# Patient Record
Sex: Female | Born: 1980
Health system: Southern US, Community
[De-identification: ages and names within clinical notes are randomized; demographics above are authoritative.]

## PROBLEM LIST (undated history)

## (undated) DIAGNOSIS — O24419 Gestational diabetes mellitus in pregnancy, unspecified control: Secondary | ICD-10-CM

## (undated) HISTORY — DX: Gestational diabetes mellitus in pregnancy, unspecified control: O24.419

---

## 2002-05-29 ENCOUNTER — Emergency Department (HOSPITAL_COMMUNITY): Admission: EM | Admit: 2002-05-29 | Discharge: 2002-05-29 | Payer: Self-pay | Admitting: Emergency Medicine

## 2002-05-29 ENCOUNTER — Encounter: Payer: Self-pay | Admitting: Emergency Medicine

## 2003-04-11 ENCOUNTER — Emergency Department (HOSPITAL_COMMUNITY): Admission: EM | Admit: 2003-04-11 | Discharge: 2003-04-12 | Payer: Self-pay | Admitting: Emergency Medicine

## 2003-09-16 ENCOUNTER — Inpatient Hospital Stay (HOSPITAL_COMMUNITY): Admission: AD | Admit: 2003-09-16 | Discharge: 2003-09-16 | Payer: Self-pay | Admitting: Family Medicine

## 2003-09-16 ENCOUNTER — Encounter (INDEPENDENT_AMBULATORY_CARE_PROVIDER_SITE_OTHER): Payer: Self-pay | Admitting: Specialist

## 2003-09-23 ENCOUNTER — Inpatient Hospital Stay (HOSPITAL_COMMUNITY): Admission: AD | Admit: 2003-09-23 | Discharge: 2003-09-23 | Payer: Self-pay | Admitting: *Deleted

## 2003-09-30 ENCOUNTER — Inpatient Hospital Stay (HOSPITAL_COMMUNITY): Admission: AD | Admit: 2003-09-30 | Discharge: 2003-09-30 | Payer: Self-pay | Admitting: Obstetrics & Gynecology

## 2004-10-29 ENCOUNTER — Inpatient Hospital Stay (HOSPITAL_COMMUNITY): Admission: AD | Admit: 2004-10-29 | Discharge: 2004-11-01 | Payer: Self-pay | Admitting: Obstetrics and Gynecology

## 2007-12-13 ENCOUNTER — Inpatient Hospital Stay (HOSPITAL_COMMUNITY): Admission: AD | Admit: 2007-12-13 | Discharge: 2007-12-15 | Payer: Self-pay | Admitting: Obstetrics & Gynecology

## 2008-11-06 ENCOUNTER — Emergency Department (HOSPITAL_COMMUNITY): Admission: EM | Admit: 2008-11-06 | Discharge: 2008-11-06 | Payer: Self-pay | Admitting: Emergency Medicine

## 2009-01-23 ENCOUNTER — Emergency Department (HOSPITAL_COMMUNITY): Admission: EM | Admit: 2009-01-23 | Discharge: 2009-01-23 | Payer: Self-pay | Admitting: Emergency Medicine

## 2009-09-25 ENCOUNTER — Emergency Department (HOSPITAL_COMMUNITY): Admission: EM | Admit: 2009-09-25 | Discharge: 2009-09-25 | Payer: Self-pay | Admitting: Family Medicine

## 2010-08-28 LAB — POCT URINALYSIS DIP (DEVICE)
Bilirubin Urine: NEGATIVE
Glucose, UA: NEGATIVE mg/dL
Hgb urine dipstick: NEGATIVE
Ketones, ur: NEGATIVE mg/dL
Nitrite: NEGATIVE
Protein, ur: NEGATIVE mg/dL
Specific Gravity, Urine: >=1.03 (ref 1.005–1.030)
Urobilinogen, UA: 0.2 mg/dL (ref 0.0–1.0)
pH: 5.5 (ref 5.0–8.0)

## 2010-08-28 LAB — POCT I-STAT, CHEM 8
Calcium, Ion: 1.16 mmol/L (ref 1.12–1.32)
Chloride: 103 mEq/L (ref 96–112)
Glucose, Bld: 85 mg/dL (ref 70–99)
HCT: 43 % (ref 36.0–46.0)
Hemoglobin: 14.6 g/dL (ref 12.0–15.0)
TCO2: 27 mmol/L (ref 0–100)

## 2010-08-28 LAB — GLUCOSE, CAPILLARY: Glucose-Capillary: 113 mg/dL — ABNORMAL HIGH (ref 70–99)

## 2010-10-26 NOTE — H&P (Signed)
Megan Barry, Megan Barry NO.:  [id number]   MEDICAL RECORD NO.:  [id number]           PATIENT TYPE:   LOCATION:                                 FACILITY:   PHYSICIAN:  Roseanna Rainbow, M.D. DATE OF BIRTH:   DATE OF ADMISSION:  10/29/2004  DATE OF DISCHARGE:                                HISTORY & PHYSICAL   CHIEF COMPLAINT:  The patient is a 30 year old, gravida 2, para 0, with an  estimated date of confinement of November 10, 2004, with an intrauterine  pregnancy at 38.2 weeks, with preeclampsia, who presents for induction of  labor.   HISTORY OF PRESENT ILLNESS:  Please see the above.  The patient has had  borderline blood pressures dating back to June 26, 2004, when, at 20  weeks, her blood pressure was 139/78, so there is a question of chronic  borderline blood pressures.  On Oct 24, 2004, her blood pressure was 151/96;  however, on repeat the blood pressure was 130/80.  At this point, she was  spilling 1+ protein; however, she had an asymptomatic bacteruria with GBS at  that time.  A PIH panel at that point was remarkable for hemoconcentration  with a hemoglobin of 13, and uric acid of 6.7.  Today, blood pressures were  159/91; on repeat, the blood pressure was 160/100.  There was 3+ proteinuria  with 2+ leukocyte esterase.  She denied any symptoms consistent with severe  preeclampsia.  Her CBGs were within normal limits per the patient's report.   ANTEPARTUM COURSE/PROBLEMS/RISKS:  1.  Please see the above.  2.  Gestational diabetes, on an oral agent.  3.  Sickle cell trait positive.   PRENATAL SCREENS:  Blood type O positive.  Antibody screen negative.  Chlamydia negative.  GBS positive.  GC negative.  Hepatitis B surface  antigen negative.  Pap smear within normal limits.  Hemoglobin 13.3,  platelets 254,000.  Sickle cell positive.  Urine culture and sensitivity -  please see the above.  RPR nonreactive.  Most recent ultrasound on Oct 26, 2004 at  37 weeks, 6 days revealed cephalic presentation.  Amniotic fluid  normal.  Placenta anterior.  Growth percentile 84th percentile.   ALLERGIES:  No known drug allergies.   MEDICATIONS:  1.  Prenatal vitamins.  2.  Glyburide.   PAST OBSTETRIC AND GYNECOLOGIC HISTORY:  Please see the above.  Remarkable  for a spontaneous abortion.   PAST MEDICAL HISTORY:  Please see the above.   FAMILY HISTORY:  Noncontributory.   SOCIAL HISTORY:  She denies any tobacco, ethanol, or drug use.   PHYSICAL EXAMINATION:  VITAL SIGNS:  Blood pressures 150s-160s/90s-100s.  GENERAL:  Well-developed, well-nourished, in no apparent distress.  ABDOMEN:  Gravid.  PELVIC:  Sterile vaginal exam - the cervix is 2-3 cm dilated, 80% effaced,  with the vertex at a -3 station.  EXTREMITIES:  Upper and lower extremities with 1 to 2+ edema.   ASSESSMENT:  Intrauterine pregnancy at 38 plus weeks with -  1.  Preeclampsia, likely mild.  2.  Gestational diabetes, on an  oral agent, euglycemic, and with an AGA      fetus.  3.  GBS positive.  4.  Favorable Bishop score.   PLAN:  Admission.  Likely magnesium sulfate seizure prophylaxis.  GBS  prophylaxis.  Induction of labor.  Likely Pitocin and artificial rupture of  membranes.      LAJ/MEDQ  D:  10/29/2004  T:  10/29/2004  Job:  [id number]

## 2011-03-07 LAB — CBC
HCT: 35.2 — ABNORMAL LOW
MCHC: 34.1
MCV: 91.7
MCV: 93
Platelets: 144 — ABNORMAL LOW
RBC: 4.22
RDW: 13.9
RDW: 14.2

## 2011-03-07 LAB — RPR: RPR Ser Ql: NONREACTIVE

## 2011-06-11 NOTE — L&D Delivery Note (Signed)
Delivery Note At 5:28 PM a viable female was delivered via Vaginal, Spontaneous Delivery (Presentation: LOA  ).  APGAR: 9, 10; weight 8 lb 3.6 oz (3731 g).   Placenta status: Intact, Spontaneous.  Cord: 3 vessels with the following complications: None.    Anesthesia: Epidural  Episiotomy: None Lacerations: 2nd degree Suture Repair: 2.0 vicryl rapide Est. Blood Loss (mL): 300 ml  Mom to AICU.  Baby to nursery-stable.  Barry,Megan Shrader A 05/20/2012, 6:17 PM

## 2011-11-06 LAB — OB RESULTS CONSOLE RPR: RPR: NONREACTIVE

## 2011-11-06 LAB — OB RESULTS CONSOLE HIV ANTIBODY (ROUTINE TESTING): HIV: NONREACTIVE

## 2011-11-06 LAB — OB RESULTS CONSOLE GC/CHLAMYDIA: Gonorrhea: NEGATIVE

## 2011-11-07 ENCOUNTER — Other Ambulatory Visit: Payer: Self-pay | Admitting: Obstetrics

## 2011-11-07 DIAGNOSIS — Z369 Encounter for antenatal screening, unspecified: Secondary | ICD-10-CM

## 2011-11-28 ENCOUNTER — Other Ambulatory Visit: Payer: Self-pay

## 2011-11-28 ENCOUNTER — Ambulatory Visit (HOSPITAL_COMMUNITY)
Admission: RE | Admit: 2011-11-28 | Discharge: 2011-11-28 | Disposition: A | Discharge status: Home / Self Care | Payer: 59 | Source: Ambulatory Visit | Attending: Obstetrics | Admitting: Obstetrics

## 2011-11-28 ENCOUNTER — Encounter (HOSPITAL_COMMUNITY): Payer: Self-pay

## 2011-11-28 ENCOUNTER — Ambulatory Visit (HOSPITAL_COMMUNITY)
Admission: RE | Admit: 2011-11-28 | Discharge: 2011-11-28 | Disposition: A | Discharge status: Home / Self Care | Payer: 59 | Source: Ambulatory Visit | Attending: Obstetrics & Gynecology | Admitting: Obstetrics & Gynecology

## 2011-11-28 DIAGNOSIS — Z3689 Encounter for other specified antenatal screening: Secondary | ICD-10-CM | POA: Insufficient documentation

## 2011-11-28 DIAGNOSIS — O351XX0 Maternal care for (suspected) chromosomal abnormality in fetus, not applicable or unspecified: Secondary | ICD-10-CM | POA: Insufficient documentation

## 2011-11-28 DIAGNOSIS — O3510X0 Maternal care for (suspected) chromosomal abnormality in fetus, unspecified, not applicable or unspecified: Secondary | ICD-10-CM | POA: Insufficient documentation

## 2011-11-28 DIAGNOSIS — Z369 Encounter for antenatal screening, unspecified: Secondary | ICD-10-CM

## 2012-01-20 ENCOUNTER — Other Ambulatory Visit: Payer: Self-pay | Admitting: Obstetrics

## 2012-01-20 DIAGNOSIS — Z3689 Encounter for other specified antenatal screening: Secondary | ICD-10-CM

## 2012-01-28 ENCOUNTER — Ambulatory Visit (HOSPITAL_COMMUNITY): Payer: 59

## 2012-02-04 ENCOUNTER — Ambulatory Visit (HOSPITAL_COMMUNITY): Payer: 59

## 2012-02-06 ENCOUNTER — Ambulatory Visit (HOSPITAL_COMMUNITY)
Admission: RE | Admit: 2012-02-06 | Discharge: 2012-02-06 | Disposition: A | Discharge status: Home / Self Care | Payer: 59 | Source: Ambulatory Visit | Attending: Obstetrics | Admitting: Obstetrics

## 2012-02-06 DIAGNOSIS — Z363 Encounter for antenatal screening for malformations: Secondary | ICD-10-CM | POA: Insufficient documentation

## 2012-02-06 DIAGNOSIS — Z1389 Encounter for screening for other disorder: Secondary | ICD-10-CM | POA: Insufficient documentation

## 2012-02-06 DIAGNOSIS — Z3689 Encounter for other specified antenatal screening: Secondary | ICD-10-CM

## 2012-02-06 DIAGNOSIS — O09299 Supervision of pregnancy with other poor reproductive or obstetric history, unspecified trimester: Secondary | ICD-10-CM | POA: Insufficient documentation

## 2012-02-06 DIAGNOSIS — O358XX0 Maternal care for other (suspected) fetal abnormality and damage, not applicable or unspecified: Secondary | ICD-10-CM | POA: Insufficient documentation

## 2012-02-21 ENCOUNTER — Inpatient Hospital Stay (HOSPITAL_COMMUNITY)
Admission: AD | Admit: 2012-02-21 | Discharge: 2012-02-21 | Disposition: A | Discharge status: Home / Self Care | Payer: 59 | Source: Ambulatory Visit | Attending: Obstetrics | Admitting: Obstetrics

## 2012-02-21 ENCOUNTER — Inpatient Hospital Stay (HOSPITAL_COMMUNITY): Payer: 59

## 2012-02-21 ENCOUNTER — Encounter (HOSPITAL_COMMUNITY): Payer: Self-pay | Admitting: *Deleted

## 2012-02-21 DIAGNOSIS — N289 Disorder of kidney and ureter, unspecified: Secondary | ICD-10-CM

## 2012-02-21 DIAGNOSIS — Z331 Pregnant state, incidental: Secondary | ICD-10-CM

## 2012-02-21 DIAGNOSIS — O99891 Other specified diseases and conditions complicating pregnancy: Secondary | ICD-10-CM | POA: Insufficient documentation

## 2012-02-21 DIAGNOSIS — R109 Unspecified abdominal pain: Secondary | ICD-10-CM | POA: Insufficient documentation

## 2012-02-21 DIAGNOSIS — N200 Calculus of kidney: Secondary | ICD-10-CM | POA: Insufficient documentation

## 2012-02-21 DIAGNOSIS — O26839 Pregnancy related renal disease, unspecified trimester: Secondary | ICD-10-CM

## 2012-02-21 LAB — URINALYSIS, ROUTINE W REFLEX MICROSCOPIC
Bilirubin Urine: NEGATIVE
Ketones, ur: NEGATIVE mg/dL
Nitrite: NEGATIVE
Specific Gravity, Urine: >1.03 — ABNORMAL HIGH (ref 1.005–1.030)
Urobilinogen, UA: 0.2 mg/dL (ref 0.0–1.0)

## 2012-02-21 MED ORDER — TAMSULOSIN HCL 0.4 MG PO CAPS: 0.4000 mg | ORAL_CAPSULE | Freq: Once | ORAL | Status: DC

## 2012-02-21 MED ORDER — MORPHINE SULFATE 10 MG/ML IJ SOLN
10.0000 mg | Freq: Once | INTRAMUSCULAR | Status: AC
  Administered 2012-02-21: 10 mg via INTRAVENOUS
  Filled 2012-02-21: qty 1

## 2012-02-21 MED ORDER — SODIUM CHLORIDE 0.9 % IJ SOLN
INTRAMUSCULAR | Status: AC
  Administered 2012-02-21: 3 mL
  Filled 2012-02-21: qty 3

## 2012-02-21 MED ORDER — OXYCODONE-ACETAMINOPHEN 5-325 MG PO TABS: 1.0000 | ORAL_TABLET | Freq: Once | ORAL | Status: DC

## 2012-02-21 MED ORDER — TAMSULOSIN HCL 0.4 MG PO CAPS
0.4000 mg | ORAL_CAPSULE | Freq: Once | ORAL | Status: AC
  Administered 2012-02-21: 0.4 mg via ORAL
  Filled 2012-02-21: qty 1

## 2012-02-21 MED ORDER — MORPHINE SULFATE 10 MG/5ML PO SOLN: 10.0000 mg | Freq: Once | ORAL | Status: DC

## 2012-02-21 MED ORDER — OXYCODONE-ACETAMINOPHEN 5-325 MG PO TABS: 1.0000 | ORAL_TABLET | ORAL | Status: AC | PRN

## 2012-02-21 MED ORDER — PROMETHAZINE HCL 25 MG/ML IJ SOLN
25.0000 mg | Freq: Once | INTRAMUSCULAR | Status: AC
  Administered 2012-02-21: 25 mg via INTRAVENOUS
  Filled 2012-02-21: qty 1

## 2012-02-21 NOTE — MAU Provider Note (Signed)
Chief Complaint:  Abdominal Pain   First Provider Initiated Contact with Patient 02/21/12 1510     HPI: Megan Barry is a 31 y.o. [id number] at 9w2dwho presents to maternity admissions reporting flank pain since last night, much more severe today, rates 10/10 on pain scale. Describes pain as sharp, intermittent, but more frequent this afternoon. Worse lying down. Has not tried any thing for pain relief. Describes it urine is dark reddish-brown. Denies fever, chills, dysuria, frequency, nausea, vomiting, diarrhea, constipation, contractions, leakage of fluid or vaginal bleeding. Good fetal movement.   Past Medical History: History reviewed. No pertinent past medical history.  Past obstetric history: OB History    Grav Para Term Preterm Abortions TAB SAB Ect Mult Living   4 2 2  1  1   2      # Outc Date GA Lbr Len/2nd Wgt Sex Del Anes PTL Lv   1 SAB 2005           2 TRM 2006 [redacted]w[redacted]d  6lb(2.722kg) F SVD      3 TRM 2009 [redacted]w[redacted]d  7lb(3.175kg) F SVD      4 CUR               Past Surgical History: Past Surgical History  Procedure Date  . Vaginal delivery     X 2    Family History: History reviewed. No pertinent family history.  Social History: History  Substance Use Topics  . Smoking status: Never Smoker   . Smokeless tobacco: Never Used  . Alcohol Use: No    Allergies: No Known Allergies  Meds:  Prescriptions prior to admission  Medication Sig Dispense Refill  . Prenatal Vit-Fe Fumarate-FA (PRENATAL MULTIVITAMIN) TABS Take 1 tablet by mouth daily.        ROS: Pertinent findings in history of present illness.  Physical Exam  Blood pressure 123/79, pulse 97, temperature 98.5 F (36.9 C), temperature source Oral, resp. rate 18, height 5\' 6"  (1.676 m), weight 104.509 kg (230 lb 6.4 oz), last menstrual period 09/04/2011, SpO2 99.00%. GENERAL: Well-developed, well-nourished female in moderate distress.  HEENT: normocephalic HEART: normal rate RESP: normal effort ABDOMEN: Soft,  non-tender, gravid appropriate for gestational age. No CVA tenderness. No uterine tenderness. EXTREMITIES: Nontender, no edema NEURO: alert and oriented SPECULUM EXAM: Deferred. No blood on bed pad.   FHT:  Baseline 150 , moderate variability, no accelerations present, no decelerations Contractions: none   Labs: Results for orders placed during the hospital encounter of 02/21/12 (from the past 24 hour(s))  URINALYSIS, ROUTINE W REFLEX MICROSCOPIC     Status: Abnormal   Collection Time   02/21/12  2:30 PM      Component Value Range   Color, Urine YELLOW  YELLOW   APPearance HAZY (*) CLEAR   Specific Gravity, Urine >1.030 (*) 1.005 - 1.030   pH 5.5  5.0 - 8.0   Glucose, UA NEGATIVE  NEGATIVE mg/dL   Hgb urine dipstick LARGE (*) NEGATIVE   Bilirubin Urine NEGATIVE  NEGATIVE   Ketones, ur NEGATIVE  NEGATIVE mg/dL   Protein, ur NEGATIVE  NEGATIVE mg/dL   Urobilinogen, UA 0.2  0.0 - 1.0 mg/dL   Nitrite NEGATIVE  NEGATIVE   Leukocytes, UA SMALL (*) NEGATIVE  URINE MICROSCOPIC-ADD ON     Status: Abnormal   Collection Time   02/21/12  2:30 PM      Component Value Range   Squamous Epithelial / LPF MANY (*) RARE   WBC, UA 21-50  <3  WBC/hpf   RBC / HPF TOO NUMEROUS TO COUNT  <3 RBC/hpf   Bacteria, UA MANY (*) RARE   Urine-Other MANY YEAST      Imaging:  US Ob Detail + 14 Wk  02/06/2012  OBSTETRICAL ULTRASOUND: This exam was performed within a [facility]. The OB US report was generated in the [facility] system, and faxed to the ordering physician.   This report is also available in [employer] and in the [employer]. See [facility] Obstetric US report.   US Renal  02/21/2012  *RADIOLOGY REPORT*  Clinical Data: Left flank pain with hematuria.  24 weeks estimated gestational age  RENAL/URINARY TRACT ULTRASOUND COMPLETE  Comparison:  None.  Findings:  Right Kidney:  Demonstrates a sagittal length of 12.1 cm.  No focal parenchymal abnormalities or signs of  hydronephrosis are evident  Left Kidney:  Demonstrates a sagittal length of 14.6 cm. Mild hydronephrosis is identified with dilatation of the extrarenal and intra renal pelves and mild distention of the calyceal system. This is more prominent than typically anticipated for a 24-week gestation.  The proximal ureter appears mildly dilated as well. No focal parenchymal abnormalities are identified.  Bladder:  Evaluation of the bladder reveals bilateral ureteral jets  IMPRESSION: Normal right kidney.  Assymetric mild hydronephrosis associated with the left kidney with no definite focal parenchymal abnormalities seen. Given that this finding affects the left side only with a history of left flank pain and hematuria and is slightly more pronounced than expected at current gestational age, a nonvisualized ureteral calculus cannot be excluded. The presence of bilateral ureteral jets would mitigate against a complete obstructive process.   Original Report Authenticated By: Bertha Stakes, M.D.    ED Course Pain improved to 2/10 on pain scale after morphine 10 mg and Phenergan 25 mg IV. Fluid bolus given. Patient resting, NAD. Dr. Clearance Coots notified of physical exam findings suggestive of kidney stone, left hydronephrosis without visualization of stone. Will arrange referral to [facility]. At time of discharge patient stated pain was returning in requested more pain medication before discharge. 1 Percocet given. Flomax given.  Assessment: 1. Kidney stone complicating pregnancy   2. Pregnancy, incidental    Plan: Discharge home Push fluids. Preterm labor precautions. Urine culture pending Follow-up Information    Call HARPER,CHARLES A, MD. (to arrange referral to urologist. )    Contact information:   [address] [place] [address] [phone]       Follow up with [facility] [place] MATERNITY ADMISSIONS. (As needed if symptoms worsen)    Contact information:    [address] [place] [address] [phone]          Medication List     As of 02/21/2012  7:17 PM    TAKE these medications         oxyCODONE-acetaminophen 5-325 MG per tablet   Commonly known as: PERCOCET/ROXICET   Take 1-2 tablets by mouth every 4 (four) hours as needed for pain.      prenatal multivitamin Tabs   Take 1 tablet by mouth daily.      Tamsulosin HCl 0.4 MG Caps   Commonly known as: FLOMAX   Take 1 capsule (0.4 mg total) by mouth once.        [place], CNM 02/21/2012 3:08 PM

## 2012-02-21 NOTE — MAU Note (Signed)
Patient states she started having constant left side pain last night. Denies bleeding or leaking and reports feeling fetal movement. States her urine is brown but has no pain with urination.

## 2012-02-23 LAB — URINE CULTURE: Colony Count: 5000

## 2012-03-30 ENCOUNTER — Ambulatory Visit: Payer: 59 | Admitting: Physical Therapy

## 2012-04-14 LAB — OB RESULTS CONSOLE GBS: GBS: NEGATIVE

## 2012-05-11 ENCOUNTER — Other Ambulatory Visit: Payer: Self-pay | Admitting: Obstetrics & Gynecology

## 2012-05-11 DIAGNOSIS — IMO0002 Reserved for concepts with insufficient information to code with codable children: Secondary | ICD-10-CM

## 2012-05-14 ENCOUNTER — Ambulatory Visit (HOSPITAL_COMMUNITY)
Admission: RE | Admit: 2012-05-14 | Discharge: 2012-05-14 | Disposition: A | Discharge status: Home / Self Care | Payer: 59 | Source: Ambulatory Visit | Attending: Obstetrics & Gynecology | Admitting: Obstetrics & Gynecology

## 2012-05-14 ENCOUNTER — Other Ambulatory Visit: Payer: Self-pay | Admitting: Obstetrics & Gynecology

## 2012-05-14 DIAGNOSIS — O24419 Gestational diabetes mellitus in pregnancy, unspecified control: Secondary | ICD-10-CM

## 2012-05-14 DIAGNOSIS — O3660X0 Maternal care for excessive fetal growth, unspecified trimester, not applicable or unspecified: Secondary | ICD-10-CM | POA: Insufficient documentation

## 2012-05-14 DIAGNOSIS — O99810 Abnormal glucose complicating pregnancy: Secondary | ICD-10-CM | POA: Insufficient documentation

## 2012-05-14 DIAGNOSIS — O09299 Supervision of pregnancy with other poor reproductive or obstetric history, unspecified trimester: Secondary | ICD-10-CM | POA: Insufficient documentation

## 2012-05-14 DIAGNOSIS — IMO0002 Reserved for concepts with insufficient information to code with codable children: Secondary | ICD-10-CM

## 2012-05-15 ENCOUNTER — Telehealth (HOSPITAL_COMMUNITY): Payer: Self-pay | Admitting: *Deleted

## 2012-05-15 ENCOUNTER — Encounter (HOSPITAL_COMMUNITY): Payer: Self-pay | Admitting: *Deleted

## 2012-05-15 NOTE — Telephone Encounter (Signed)
Preadmission screen  

## 2012-05-18 ENCOUNTER — Encounter (HOSPITAL_COMMUNITY): Payer: Self-pay | Admitting: *Deleted

## 2012-05-18 ENCOUNTER — Ambulatory Visit (HOSPITAL_COMMUNITY)
Admission: RE | Admit: 2012-05-18 | Discharge: 2012-05-18 | Disposition: A | Discharge status: Home / Self Care | Payer: 59 | Source: Ambulatory Visit | Attending: Obstetrics & Gynecology | Admitting: Obstetrics & Gynecology

## 2012-05-18 ENCOUNTER — Inpatient Hospital Stay (HOSPITAL_COMMUNITY)
Admission: AD | Admit: 2012-05-18 | Discharge: 2012-05-18 | Disposition: A | Discharge status: Home / Self Care | Payer: 59 | Source: Ambulatory Visit | Attending: Obstetrics | Admitting: Obstetrics

## 2012-05-18 DIAGNOSIS — O24419 Gestational diabetes mellitus in pregnancy, unspecified control: Secondary | ICD-10-CM

## 2012-05-18 DIAGNOSIS — O99810 Abnormal glucose complicating pregnancy: Secondary | ICD-10-CM | POA: Insufficient documentation

## 2012-05-18 DIAGNOSIS — Z362 Encounter for other antenatal screening follow-up: Secondary | ICD-10-CM

## 2012-05-18 DIAGNOSIS — O3660X0 Maternal care for excessive fetal growth, unspecified trimester, not applicable or unspecified: Secondary | ICD-10-CM | POA: Insufficient documentation

## 2012-05-18 LAB — LSPG (L/S RATIO WITH PG)-AMNIO FLUID

## 2012-05-18 NOTE — Procedures (Signed)
Informed consent had been obtained.  The abdomen was prepped/draped.  An attempt was made to introduce a spinal needle into the amniotic cavity.  A fetal limb moved into pocket and the needle could not be advanced.  The needle was removed.  A second attempt was made and 10 cc of slightly blood-tinged fluid was aspirated.  A normal heart rate was observed after the procedure.

## 2012-05-18 NOTE — MAU Provider Note (Signed)
HPI: Dezyre Hoefer is a 31 y.o. year old [id number] female at [redacted]w[redacted]d weeks gestation who was sent to MAU for NSt after Amnio. Denies pain, VB, LOF. NST category I.  Discharge home. Return to MAUfor VB LOF, labor or decreased FM.  Follow-up Information    Follow up with [facility] BIRTHING SUITES. On 05/20/2012.      Follow up with [facility] [place] MATERNITY ADMISSIONS. (As needed)    Contact information:   [address] [id number] [place] [address] [phone]           Medication List     As of 05/18/2012  3:21 PM    CONTINUE taking these medications         insulin glargine 100 UNIT/ML injection   Commonly known as: LANTUS      insulin lispro 100 UNIT/ML injection   Commonly known as: HUMALOG      prenatal multivitamin Tabs          Dorathy Kinsman, CNM 05/18/2012 3:21 PM

## 2012-05-18 NOTE — MAU Note (Signed)
Pt seen @ MFM for amniocentesis today, needs NST.

## 2012-05-18 NOTE — MAU Note (Signed)
Scheduled for induction this Wed. (05/20/12);

## 2012-05-20 ENCOUNTER — Inpatient Hospital Stay (HOSPITAL_COMMUNITY): Payer: 59 | Admitting: Anesthesiology

## 2012-05-20 ENCOUNTER — Encounter (HOSPITAL_COMMUNITY): Payer: Self-pay

## 2012-05-20 ENCOUNTER — Inpatient Hospital Stay (HOSPITAL_COMMUNITY)
Admission: RE | Admit: 2012-05-20 | Discharge: 2012-05-23 | DRG: 775 | Disposition: A | Discharge status: Home / Self Care | Payer: 59 | Source: Ambulatory Visit | Attending: Obstetrics & Gynecology | Admitting: Obstetrics & Gynecology

## 2012-05-20 ENCOUNTER — Encounter (HOSPITAL_COMMUNITY): Payer: Self-pay | Admitting: Anesthesiology

## 2012-05-20 DIAGNOSIS — O99814 Abnormal glucose complicating childbirth: Principal | ICD-10-CM | POA: Diagnosis present

## 2012-05-20 DIAGNOSIS — O1490 Unspecified pre-eclampsia, unspecified trimester: Secondary | ICD-10-CM | POA: Clinically undetermined

## 2012-05-20 DIAGNOSIS — D689 Coagulation defect, unspecified: Secondary | ICD-10-CM | POA: Diagnosis present

## 2012-05-20 DIAGNOSIS — D696 Thrombocytopenia, unspecified: Secondary | ICD-10-CM | POA: Diagnosis present

## 2012-05-20 DIAGNOSIS — O24919 Unspecified diabetes mellitus in pregnancy, unspecified trimester: Secondary | ICD-10-CM | POA: Diagnosis present

## 2012-05-20 DIAGNOSIS — O3660X0 Maternal care for excessive fetal growth, unspecified trimester, not applicable or unspecified: Secondary | ICD-10-CM | POA: Diagnosis present

## 2012-05-20 LAB — COMPREHENSIVE METABOLIC PANEL
Alkaline Phosphatase: 108 U/L (ref 39–117)
BUN: 7 mg/dL (ref 6–23)
CO2: 21 mEq/L (ref 19–32)
GFR calc Af Amer: >90 mL/min (ref >90)
GFR calc non Af Amer: >90 mL/min (ref >90)
Glucose, Bld: 94 mg/dL (ref 70–99)
Potassium: 3.6 mEq/L (ref 3.5–5.1)
Total Protein: 5.8 g/dL — ABNORMAL LOW (ref 6.0–8.3)

## 2012-05-20 LAB — CBC
HCT: 36.4 % (ref 36.0–46.0)
MCH: 29.9 pg (ref 26.0–34.0)
MCHC: 33.5 g/dL (ref 30.0–36.0)
MCV: 89.2 fL (ref 78.0–100.0)
Platelets: 128 10*3/uL — ABNORMAL LOW (ref 150–400)
RDW: 13.8 % (ref 11.5–15.5)

## 2012-05-20 LAB — LACTATE DEHYDROGENASE: LDH: 138 U/L (ref 94–250)

## 2012-05-20 LAB — ABO/RH: ABO/RH(D): O POS

## 2012-05-20 LAB — GLUCOSE, CAPILLARY: Glucose-Capillary: 99 mg/dL (ref 70–99)

## 2012-05-20 MED ORDER — TETANUS-DIPHTH-ACELL PERTUSSIS 5-2.5-18.5 LF-MCG/0.5 IM SUSP
0.5000 mL | Freq: Once | INTRAMUSCULAR | Status: AC
  Administered 2012-05-21: 0.5 mL via INTRAMUSCULAR
  Filled 2012-05-20: qty 0.5

## 2012-05-20 MED ORDER — PHENYLEPHRINE 40 MCG/ML (10ML) SYRINGE FOR IV PUSH (FOR BLOOD PRESSURE SUPPORT): 80.0000 ug | PREFILLED_SYRINGE | INTRAVENOUS | Status: DC | PRN

## 2012-05-20 MED ORDER — MAGNESIUM SULFATE BOLUS VIA INFUSION
4.0000 g | Freq: Once | INTRAVENOUS | Status: AC
  Administered 2012-05-20: 4 g via INTRAVENOUS
  Filled 2012-05-20: qty 500

## 2012-05-20 MED ORDER — PHENYLEPHRINE 40 MCG/ML (10ML) SYRINGE FOR IV PUSH (FOR BLOOD PRESSURE SUPPORT)
80.0000 ug | PREFILLED_SYRINGE | INTRAVENOUS | Status: DC | PRN
  Filled 2012-05-20: qty 5

## 2012-05-20 MED ORDER — INSULIN ASPART 100 UNIT/ML ~~LOC~~ SOLN: 2.0000 [IU] | SUBCUTANEOUS | Status: DC

## 2012-05-20 MED ORDER — OXYTOCIN 40 UNITS IN LACTATED RINGERS INFUSION - SIMPLE MED
62.5000 mL/h | INTRAVENOUS | Status: DC
  Administered 2012-05-20: 999 mL/h via INTRAVENOUS

## 2012-05-20 MED ORDER — LACTATED RINGERS IV SOLN
500.0000 mL | INTRAVENOUS | Status: DC | PRN
Start: 2012-05-20 — End: 2012-05-20

## 2012-05-20 MED ORDER — LACTATED RINGERS IV SOLN
INTRAVENOUS | Status: DC
  Administered 2012-05-20 – 2012-05-21 (×2): via INTRAVENOUS

## 2012-05-20 MED ORDER — EPHEDRINE 5 MG/ML INJ
10.0000 mg | INTRAVENOUS | Status: DC | PRN
  Filled 2012-05-20: qty 4

## 2012-05-20 MED ORDER — OXYCODONE-ACETAMINOPHEN 5-325 MG PO TABS: 1.0000 | ORAL_TABLET | ORAL | Status: DC | PRN

## 2012-05-20 MED ORDER — ZOLPIDEM TARTRATE 5 MG PO TABS: 5.0000 mg | ORAL_TABLET | Freq: Every evening | ORAL | Status: DC | PRN

## 2012-05-20 MED ORDER — DIPHENHYDRAMINE HCL 50 MG/ML IJ SOLN: 12.5000 mg | INTRAMUSCULAR | Status: DC | PRN

## 2012-05-20 MED ORDER — SODIUM BICARBONATE 8.4 % IV SOLN
INTRAVENOUS | Status: DC | PRN
  Administered 2012-05-20: 5 mL via EPIDURAL

## 2012-05-20 MED ORDER — ONDANSETRON HCL 4 MG/2ML IJ SOLN: 4.0000 mg | Freq: Four times a day (QID) | INTRAMUSCULAR | Status: DC | PRN

## 2012-05-20 MED ORDER — IBUPROFEN 600 MG PO TABS: 600.0000 mg | ORAL_TABLET | Freq: Four times a day (QID) | ORAL | Status: DC | PRN

## 2012-05-20 MED ORDER — DIBUCAINE 1 % RE OINT: 1.0000 "application " | TOPICAL_OINTMENT | RECTAL | Status: DC | PRN

## 2012-05-20 MED ORDER — OXYTOCIN BOLUS FROM INFUSION: 500.0000 mL | INTRAVENOUS | Status: DC

## 2012-05-20 MED ORDER — PRENATAL MULTIVITAMIN CH
1.0000 | ORAL_TABLET | Freq: Every day | ORAL | Status: DC
  Administered 2012-05-21 – 2012-05-23 (×3): 1 via ORAL
  Filled 2012-05-20 (×3): qty 1

## 2012-05-20 MED ORDER — FERROUS SULFATE 325 (65 FE) MG PO TABS
325.0000 mg | ORAL_TABLET | Freq: Two times a day (BID) | ORAL | Status: DC
  Administered 2012-05-21 – 2012-05-23 (×5): 325 mg via ORAL
  Filled 2012-05-20 (×5): qty 1

## 2012-05-20 MED ORDER — TERBUTALINE SULFATE 1 MG/ML IJ SOLN: 0.2500 mg | Freq: Once | INTRAMUSCULAR | Status: DC | PRN

## 2012-05-20 MED ORDER — DIPHENHYDRAMINE HCL 25 MG PO CAPS: 25.0000 mg | ORAL_CAPSULE | Freq: Four times a day (QID) | ORAL | Status: DC | PRN

## 2012-05-20 MED ORDER — LIDOCAINE HCL (PF) 1 % IJ SOLN
30.0000 mL | INTRAMUSCULAR | Status: AC | PRN
  Administered 2012-05-20: 30 mL via SUBCUTANEOUS
  Filled 2012-05-20: qty 30

## 2012-05-20 MED ORDER — MAGNESIUM SULFATE 40 G IN LACTATED RINGERS - SIMPLE
2.0000 g/h | INTRAVENOUS | Status: AC
  Administered 2012-05-21: 2 g/h via INTRAVENOUS
  Filled 2012-05-20 (×2): qty 500

## 2012-05-20 MED ORDER — BUTORPHANOL TARTRATE 1 MG/ML IJ SOLN
1.0000 mg | INTRAMUSCULAR | Status: DC | PRN
  Filled 2012-05-20: qty 2

## 2012-05-20 MED ORDER — HYDROXYZINE HCL 50 MG PO TABS: 50.0000 mg | ORAL_TABLET | Freq: Four times a day (QID) | ORAL | Status: DC | PRN

## 2012-05-20 MED ORDER — FENTANYL 2.5 MCG/ML BUPIVACAINE 1/10 % EPIDURAL INFUSION (WH - ANES)
14.0000 mL/h | INTRAMUSCULAR | Status: DC
  Administered 2012-05-20: 14 mL/h via EPIDURAL
  Filled 2012-05-20: qty 125

## 2012-05-20 MED ORDER — LACTATED RINGERS IV SOLN
500.0000 mL | Freq: Once | INTRAVENOUS | Status: AC
  Administered 2012-05-20: 500 mL via INTRAVENOUS

## 2012-05-20 MED ORDER — EPHEDRINE 5 MG/ML INJ: 10.0000 mg | INTRAVENOUS | Status: DC | PRN

## 2012-05-20 MED ORDER — BENZOCAINE-MENTHOL 20-0.5 % EX AERO
1.0000 "application " | INHALATION_SPRAY | CUTANEOUS | Status: DC | PRN
  Filled 2012-05-20: qty 56

## 2012-05-20 MED ORDER — SENNOSIDES-DOCUSATE SODIUM 8.6-50 MG PO TABS
2.0000 | ORAL_TABLET | Freq: Every day | ORAL | Status: DC
  Administered 2012-05-20 – 2012-05-22 (×3): 2 via ORAL

## 2012-05-20 MED ORDER — CITRIC ACID-SODIUM CITRATE 334-500 MG/5ML PO SOLN: 30.0000 mL | ORAL | Status: DC | PRN

## 2012-05-20 MED ORDER — MAGNESIUM HYDROXIDE 400 MG/5ML PO SUSP
30.0000 mL | ORAL | Status: DC | PRN
  Filled 2012-05-20: qty 30

## 2012-05-20 MED ORDER — ACETAMINOPHEN 325 MG PO TABS: 650.0000 mg | ORAL_TABLET | ORAL | Status: DC | PRN

## 2012-05-20 MED ORDER — MEASLES, MUMPS & RUBELLA VAC ~~LOC~~ INJ: 0.5000 mL | INJECTION | Freq: Once | SUBCUTANEOUS | Status: DC

## 2012-05-20 MED ORDER — ONDANSETRON HCL 4 MG/2ML IJ SOLN: 4.0000 mg | INTRAMUSCULAR | Status: DC | PRN

## 2012-05-20 MED ORDER — WITCH HAZEL-GLYCERIN EX PADS: 1.0000 "application " | MEDICATED_PAD | CUTANEOUS | Status: DC | PRN

## 2012-05-20 MED ORDER — LACTATED RINGERS IV SOLN
INTRAVENOUS | Status: DC
  Administered 2012-05-20: 125 mL/h via INTRAVENOUS
  Administered 2012-05-20: 15:00:00 via INTRAVENOUS

## 2012-05-20 MED ORDER — OXYCODONE-ACETAMINOPHEN 5-325 MG PO TABS
1.0000 | ORAL_TABLET | ORAL | Status: DC | PRN
  Administered 2012-05-21 – 2012-05-23 (×10): 2 via ORAL
  Filled 2012-05-20 (×10): qty 2

## 2012-05-20 MED ORDER — OXYTOCIN 40 UNITS IN LACTATED RINGERS INFUSION - SIMPLE MED
1.0000 m[IU]/min | INTRAVENOUS | Status: DC
  Administered 2012-05-20: 2 m[IU]/min via INTRAVENOUS
  Filled 2012-05-20: qty 1000

## 2012-05-20 MED ORDER — ONDANSETRON HCL 4 MG PO TABS: 4.0000 mg | ORAL_TABLET | ORAL | Status: DC | PRN

## 2012-05-20 MED ORDER — BUTORPHANOL TARTRATE 1 MG/ML IJ SOLN
2.0000 mg | INTRAMUSCULAR | Status: DC | PRN
  Administered 2012-05-20: 2 mg via INTRAVENOUS

## 2012-05-20 MED ORDER — HYDROXYZINE HCL 50 MG/ML IM SOLN: 50.0000 mg | Freq: Four times a day (QID) | INTRAMUSCULAR | Status: DC | PRN

## 2012-05-20 MED ORDER — LANOLIN HYDROUS EX OINT: TOPICAL_OINTMENT | CUTANEOUS | Status: DC | PRN

## 2012-05-20 NOTE — H&P (Signed)
Megan Barry is Barry 31 y.o. female presenting for IOL. Maternal Medical History:  Reason for admission: Barry recent U/S showed biometry consistent with an LGA fetus.  Her glycemic control has been borderline on insulin.  Fetal activity: Perceived fetal activity is normal.    Prenatal Complications - Diabetes: Diabetes is managed by insulin injections.      OB History    Grav Para Term Preterm Abortions TAB SAB Ect Mult Living   4 2 2  1  1   2      Past Medical History  Diagnosis Date  . Gestational diabetes     insulin   Past Surgical History  Procedure Date  . Vaginal delivery     X 2   Family History: family history includes Diabetes in her mother and Hypertension in her mother. Social History:  reports that she has never smoked. She has never used smokeless tobacco. She reports that she does not drink alcohol or use illicit drugs.     Review of Systems  Constitutional: Negative for fever.  Eyes: Negative for blurred vision.  Respiratory: Negative for shortness of breath.   Gastrointestinal: Negative for vomiting.  Skin: Negative for rash.  Neurological: Negative for headaches.    Dilation: 2 Effacement (%): 50 Station: -3 Exam by:: lee Blood pressure 146/72, pulse 86, temperature 98.5 F (36.9 C), temperature source Oral, resp. rate 16, height 5\' 8"  (1.727 m), weight 110.224 kg (243 lb), last menstrual period 09/04/2011. Maternal Exam:  Introitus: not evaluated.   Cervix: Cervix evaluated by digital exam.     Fetal Exam Fetal Monitor Review: Variability: moderate (6-25 bpm).   Pattern: accelerations present and no decelerations.    Fetal State Assessment: Category I - tracings are normal.     Physical Exam  Constitutional: She appears well-developed.  HENT:  Head: Normocephalic.  Neck: Neck supple. No thyromegaly present.  Cardiovascular: Normal rate and regular rhythm.   Respiratory: Breath sounds normal.  GI: Soft. Bowel sounds are normal.  Skin: No  rash noted.    Prenatal labs: ABO, Rh: --/--/O POS, O POS (12/11 0815) Antibody: NEG (12/11 0815) Rubella: Immune (05/29 0000) RPR: Nonreactive (05/29 0000)  HBsAg: Negative (05/29 0000)  HIV: Non-reactive (05/29 0000)  GBS: Negative (11/05 0000)   Assessment/Plan: Multipara w/an IUP @ [redacted]w[redacted]d.  Pregnancy complicated by GDM managed with insulin.  Borderline control/fetus w/suspected macrosomia.  S/P U/S for FLM--PG present. New onset mild thrombocytopenia--likely gestational  Admit Sliding scale insulin during latent labor-->glucomander in active labor PIH labs Low dose Pitocin per protocol   Megan Barry 05/20/2012, 12:24 PM

## 2012-05-20 NOTE — Anesthesia Procedure Notes (Signed)

## 2012-05-20 NOTE — Progress Notes (Signed)
Pt. Has only had gestational DM and CBG's normal today. No blood sugar issues outside of pregnancy

## 2012-05-20 NOTE — Anesthesia Preprocedure Evaluation (Signed)
Anesthesia Evaluation  Patient identified by MRN, date of birth, ID band Patient awake    Reviewed: Allergy & Precautions, H&P , Patient's Chart, lab work & pertinent test results  Airway Mallampati: II TM Distance: >3 FB Neck ROM: full    Dental  (+) Teeth Intact   Pulmonary  breath sounds clear to auscultation        Cardiovascular Rhythm:regular Rate:Normal     Neuro/Psych    GI/Hepatic   Endo/Other  diabetesMorbid obesity  Renal/GU      Musculoskeletal   Abdominal   Peds  Hematology   Anesthesia Other Findings       Reproductive/Obstetrics (+) Pregnancy                           Anesthesia Physical Anesthesia Plan  ASA: III  Anesthesia Plan: Epidural   Post-op Pain Management:    Induction:   Airway Management Planned:   Additional Equipment:   Intra-op Plan:   Post-operative Plan:   Informed Consent: I have reviewed the patients History and Physical, chart, labs and discussed the procedure including the risks, benefits and alternatives for the proposed anesthesia with the patient or authorized representative who has indicated his/her understanding and acceptance.   Dental Advisory Given  Plan Discussed with:   Anesthesia Plan Comments: (Labs checked- platelets confirmed with RN in room. Fetal heart tracing, per RN, reported to be stable enough for sitting procedure. Discussed epidural, and patient consents to the procedure:  included risk of possible headache,backache, failed block, allergic reaction, and nerve injury. This patient was asked if she had any questions or concerns before the procedure started. )        Anesthesia Quick Evaluation

## 2012-05-21 LAB — CBC
HCT: 32.2 % — ABNORMAL LOW (ref 36.0–46.0)
Hemoglobin: 10.9 g/dL — ABNORMAL LOW (ref 12.0–15.0)
MCHC: 33.9 g/dL (ref 30.0–36.0)
RBC: 3.58 MIL/uL — ABNORMAL LOW (ref 3.87–5.11)
WBC: 8.8 10*3/uL (ref 4.0–10.5)

## 2012-05-21 LAB — COMPREHENSIVE METABOLIC PANEL
ALT: 12 U/L (ref 0–35)
Alkaline Phosphatase: 93 U/L (ref 39–117)
BUN: 6 mg/dL (ref 6–23)
CO2: 26 mEq/L (ref 19–32)
Chloride: 102 mEq/L (ref 96–112)
GFR calc Af Amer: >90 mL/min (ref >90)
Glucose, Bld: 118 mg/dL — ABNORMAL HIGH (ref 70–99)
Potassium: 4 mEq/L (ref 3.5–5.1)
Sodium: 137 mEq/L (ref 135–145)
Total Bilirubin: 0.2 mg/dL — ABNORMAL LOW (ref 0.3–1.2)

## 2012-05-21 LAB — LACTATE DEHYDROGENASE: LDH: 305 U/L — ABNORMAL HIGH (ref 94–250)

## 2012-05-21 LAB — GLUCOSE, CAPILLARY
Glucose-Capillary: 112 mg/dL — ABNORMAL HIGH (ref 70–99)
Glucose-Capillary: 102 mg/dL — ABNORMAL HIGH (ref 70–99)

## 2012-05-21 MED ORDER — MENTHOL 3 MG MT LOZG
1.0000 | LOZENGE | OROMUCOSAL | Status: DC | PRN
  Administered 2012-05-21: 3 mg via ORAL
  Filled 2012-05-21: qty 9

## 2012-05-21 NOTE — Progress Notes (Signed)
UR chart review completed.  

## 2012-05-21 NOTE — Anesthesia Postprocedure Evaluation (Signed)
  Anesthesia Post-op Note  Patient: Megan Barry  Procedure(s) Performed: * No procedures listed *  Patient Location: [facility] Unit  Anesthesia Type:Epidural  Level of Consciousness: awake, alert , oriented and patient cooperative  Airway and Oxygen Therapy: Patient Spontanous Breathing  Post-op Pain: mild  Post-op Assessment: Patient's Cardiovascular Status Stable, Respiratory Function Stable and No signs of Nausea or vomiting  Post-op Vital Signs: stable  Complications: No apparent anesthesia complications

## 2012-05-21 NOTE — Progress Notes (Signed)
Post Partum Day 1 Subjective: no complaints  Objective: Blood pressure 133/77, pulse 87, temperature 97.9 F (36.6 C), temperature source Oral, resp. rate 18, height 5\' 8"  (1.727 m), weight 236 lb 8 oz (107.276 kg), last menstrual period 09/04/2011, SpO2 98.00%, unknown if currently breastfeeding.  Physical Exam:  General: alert and no distress Lochia: appropriate Uterine Fundus: firm Incision: healing well DVT Evaluation: No evidence of DVT seen on physical exam.   Basename 05/21/12 0500 05/20/12 0815  HGB 10.9* 12.2  HCT 32.2* 36.4    Assessment/Plan: Plan for discharge tomorrow   LOS: 1 day   Harl Wiechmann A 05/21/2012, 1:19 PM

## 2012-05-22 DIAGNOSIS — M7989 Other specified soft tissue disorders: Secondary | ICD-10-CM

## 2012-05-22 MED ORDER — OXYCODONE-ACETAMINOPHEN 5-325 MG PO TABS: 1.0000 | ORAL_TABLET | ORAL | Status: DC | PRN

## 2012-05-22 MED ORDER — PNEUMOCOCCAL VAC POLYVALENT 25 MCG/0.5ML IJ INJ
0.5000 mL | INJECTION | INTRAMUSCULAR | Status: DC
  Filled 2012-05-22: qty 0.5

## 2012-05-22 NOTE — Discharge Summary (Signed)
Obstetric Discharge Summary Reason for Admission: induction of labor Prenatal Procedures: NST and ultrasound Intrapartum Procedures: spontaneous vaginal delivery Postpartum Procedures: none Complications-Operative and Postpartum: none Hemoglobin  Date Value Range Status  05/21/2012 10.9* 12.0 - 15.0 g/dL Final     HCT  Date Value Range Status  05/21/2012 32.2* 36.0 - 46.0 % Final    Physical Exam:  General: alert and no distress Lochia: appropriate Uterine Fundus: firm Incision: none DVT Evaluation: No evidence of DVT seen on physical exam.  Discharge Diagnoses: Term Pregnancy-delivered  Discharge Information: Date: 05/22/2012 Activity: pelvic rest Diet: routine Medications: PNV, Colace and Percocet Condition: stable Instructions: refer to practice specific booklet Discharge to: home Follow-up Information    Follow up with Antionette Char A, MD. Schedule an appointment as soon as possible for a visit in 6 weeks.   Contact information:   [address] [place] [address] [phone]          Newborn Data: Live born female  Birth Weight: 8 lb 3.6 oz (3731 g) APGAR: 9, 10  Home with mother.  HARPER,CHARLES A 05/22/2012, 11:26 AM

## 2012-05-22 NOTE — Progress Notes (Signed)
VASCULAR LAB PRELIMINARY  PRELIMINARY  PRELIMINARY  PRELIMINARY  Left lower extremity venous duplex completed.    Preliminary report:  Left:  No evidence of DVT, superficial thrombosis, or Baker's cyst.  Landen Knoedler, RVS 05/22/2012, 6:27 PM

## 2012-05-22 NOTE — Progress Notes (Signed)
Post Partum Day 2 Subjective: no complaints  Objective: Blood pressure 135/80, pulse 81, temperature 97.6 F (36.4 C), temperature source Oral, resp. rate 18, height 5\' 8"  (1.727 m), weight 236 lb 8 oz (107.276 kg), last menstrual period 09/04/2011, SpO2 100.00%, unknown if currently breastfeeding.  Physical Exam:  General: alert and no distress Lochia: appropriate Uterine Fundus: firm Incision: None DVT Evaluation: No evidence of DVT seen on physical exam.   Basename 05/21/12 0500 05/20/12 0815  HGB 10.9* 12.2  HCT 32.2* 36.4    Assessment/Plan: Discharge home   LOS: 2 days   HARPER,CHARLES A 05/22/2012, 11:17 AM

## 2012-05-22 NOTE — Progress Notes (Signed)
Patient states she is feeling much better today. Mag turned off this am 0540, patient states she is ready to go home. Vs wnl, will cont to monitor.

## 2012-05-23 LAB — TYPE AND SCREEN
Antibody Screen: NEGATIVE
Unit division: 0

## 2012-05-23 NOTE — Discharge Summary (Signed)
Obstetric Discharge Summary Reason for Admission: onset of labor Prenatal Procedures: none Intrapartum Procedures: spontaneous vaginal delivery Postpartum Procedures: none Complications-Operative and Postpartum: none Hemoglobin  Date Value Range Status  05/21/2012 10.9* 12.0 - 15.0 g/dL Final     HCT  Date Value Range Status  05/21/2012 32.2* 36.0 - 46.0 % Final    Physical Exam:  General: alert Lochia: appropriate Uterine Fundus: firm Incision: healing well DVT Evaluation: No evidence of DVT seen on physical exam.  Discharge Diagnoses: Term Pregnancy-delivered  Discharge Information: Date: 05/23/2012 Activity: pelvic rest Diet: routine Medications: Percocet Condition: stable Instructions: refer to practice specific booklet Discharge to: home Follow-up Information    Follow up with Antionette Char A, MD. Schedule an appointment as soon as possible for a visit in 6 weeks.   Contact information:   [address], [address] [place] [address] [phone]          Newborn Data: Live born female  Birth Weight: 8 lb 3.6 oz (3731 g) APGAR: 9, 10  Home with mother.  MARSHALL,BERNARD A 05/23/2012, 7:31 AM

## 2013-10-21 ENCOUNTER — Encounter (HOSPITAL_COMMUNITY): Payer: Self-pay | Admitting: Emergency Medicine

## 2013-10-21 ENCOUNTER — Emergency Department (HOSPITAL_COMMUNITY)
Admission: EM | Admit: 2013-10-21 | Discharge: 2013-10-21 | Disposition: A | Discharge status: Home / Self Care | Payer: BC Managed Care – PPO | Source: Home / Self Care | Attending: Family Medicine | Admitting: Family Medicine

## 2013-10-21 DIAGNOSIS — J069 Acute upper respiratory infection, unspecified: Secondary | ICD-10-CM

## 2013-10-21 DIAGNOSIS — J9801 Acute bronchospasm: Secondary | ICD-10-CM

## 2013-10-21 MED ORDER — ALBUTEROL SULFATE HFA 108 (90 BASE) MCG/ACT IN AERS: 1.0000 | INHALATION_SPRAY | Freq: Four times a day (QID) | RESPIRATORY_TRACT | Status: DC | PRN

## 2013-10-21 MED ORDER — PREDNISONE 10 MG PO TABS
ORAL_TABLET | ORAL | Status: DC
Start: 2013-10-21 — End: 2019-12-20

## 2013-10-21 MED ORDER — IPRATROPIUM BROMIDE 0.02 % IN SOLN
RESPIRATORY_TRACT | Status: AC
  Filled 2013-10-21: qty 2.5

## 2013-10-21 MED ORDER — PREDNISONE 20 MG PO TABS
60.0000 mg | ORAL_TABLET | Freq: Once | ORAL | Status: AC
  Administered 2013-10-21: 60 mg via ORAL

## 2013-10-21 MED ORDER — PREDNISONE 20 MG PO TABS
ORAL_TABLET | ORAL | Status: AC
  Filled 2013-10-21: qty 3

## 2013-10-21 MED ORDER — IPRATROPIUM BROMIDE 0.02 % IN SOLN
0.5000 mg | Freq: Once | RESPIRATORY_TRACT | Status: AC
  Administered 2013-10-21: 0.5 mg via RESPIRATORY_TRACT

## 2013-10-21 MED ORDER — ALBUTEROL SULFATE (2.5 MG/3ML) 0.083% IN NEBU
5.0000 mg | INHALATION_SOLUTION | Freq: Once | RESPIRATORY_TRACT | Status: AC
  Administered 2013-10-21: 5 mg via RESPIRATORY_TRACT

## 2013-10-21 MED ORDER — ALBUTEROL SULFATE (2.5 MG/3ML) 0.083% IN NEBU
INHALATION_SOLUTION | RESPIRATORY_TRACT | Status: AC
  Filled 2013-10-21: qty 6

## 2013-10-21 NOTE — ED Notes (Signed)
Pt   Reports       Symptoms  Of  Congested   Wheezing  Tightness  In  Chest   And  Sensation of  heavyness  In  Her  Chest  With  Onset of  Symptoms     Yesterday

## 2013-10-21 NOTE — ED Provider Notes (Signed)
CSN: [id number]     Arrival date & time 10/21/13  0803 History   First MD Initiated Contact with Patient 10/21/13 0840     Chief Complaint  Patient presents with  . 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 female presenting with URI. The history is provided by the patient.  URI Presenting symptoms: congestion, cough and rhinorrhea   Presenting symptoms: no fatigue, no fever and no sore throat   Severity:  Moderate Onset quality:  Gradual Duration:  2 days Timing:  Constant Progression:  Worsening Chronicity:  New Associated symptoms: wheezing   Associated symptoms: no arthralgias, no headaches, no myalgias, no neck pain, no sinus pain, no sneezing and no swollen glands   Risk factors: sick contacts   Risk factors comment:  +daughter ill with same   Past Medical History  Diagnosis Date  . Gestational diabetes     insulin   Past Surgical History  Procedure Laterality Date  . Vaginal delivery      X 2   Family History  Problem Relation Age of Onset  . Diabetes Mother   . Hypertension Mother    History  Substance Use Topics  . Smoking status: Never Smoker   . Smokeless tobacco: Never Used  . Alcohol Use: No   OB History   Grav Para Term Preterm Abortions TAB SAB Ect Mult Living   4 3 3  1  1   3      Review of Systems  Constitutional: Negative for fever, chills and fatigue.  HENT: Positive for congestion and rhinorrhea. Negative for sneezing and sore throat.   Eyes: Negative.   Respiratory: Positive for cough, chest tightness, shortness of breath and wheezing.   Cardiovascular: Negative.   Gastrointestinal: Negative.   Genitourinary: Negative.   Musculoskeletal: Negative for arthralgias, myalgias and neck pain.  Skin: Negative.   Neurological: Negative for dizziness, weakness, light-headedness and headaches.     Allergies  Review of patient's allergies indicates no known allergies.  Home Medications   Prior to Admission medications   Medication Sig Start Date End Date Taking? Authorizing Provider  oxyCODONE-acetaminophen (PERCOCET/ROXICET) 5-325 MG per tablet Take 1-2 tablets by mouth every 4 (four) hours as needed for pain (moderate - severe pain). 05/22/12  Yes Brock Badharles A Harper, MD  Prenatal Vit-Fe Fumarate-FA (PRENATAL MULTIVITAMIN) TABS Take 1 tablet by mouth daily.    Historical Provider, MD   BP 130/77  Pulse 102  Temp(Src) 98.6 F (37 C) (Oral)  Resp 22  SpO2 97%  LMP 09/19/2013  Breastfeeding? No Physical Exam  Nursing note and vitals reviewed. Constitutional: She is oriented to person, place, and time. She appears well-developed and well-nourished. No distress.  HENT:  Head: Normocephalic and atraumatic.  Right Ear: Hearing, tympanic membrane, external ear and ear canal normal. Tympanic membrane is not injected.  Left Ear: Hearing, tympanic membrane, external ear and ear canal normal. Tympanic membrane is not injected.  Nose: Nose normal.  Mouth/Throat: Uvula is midline, oropharynx is clear and moist and mucous membranes are normal. No oral lesions. No trismus in the jaw.  Eyes: Conjunctivae are normal. Right eye exhibits no discharge. Left eye exhibits no discharge. No scleral icterus.  Neck: Normal range of motion. Neck supple.  Cardiovascular: Normal rate, regular rhythm and normal heart sounds.   Pulmonary/Chest: Effort normal. No respiratory distress. She has wheezes. She has no rales. She exhibits no  tenderness.  Abdominal: Soft. Bowel sounds are normal. She exhibits no distension. There is no tenderness.  Musculoskeletal: Normal range of motion.  Lymphadenopathy:    She has no cervical adenopathy.  Neurological: She is alert and oriented to person, place, and time.  Skin: Skin is warm and dry.  Psychiatric: She has a normal mood and affect. Her behavior is normal.     ED Course  Procedures (including critical care time) Labs Review Labs Reviewed - No data to display  Imaging Review No results found.   MDM   1. URI (upper respiratory infection)   2. Bronchospasm    Likely same viral URI as her young daughter, with associated bronchospasm. [facility] and albuterol MDI and advise close follow up with her PCP.     Jess BartersJennifer Lee KimballPresson, GeorgiaPA 10/21/13 [phone]

## 2013-10-21 NOTE — ED Provider Notes (Signed)
Medical screening examination/treatment/procedure(s) were performed by a resident physician or non-physician practitioner and as the supervising physician I was immediately available for consultation/collaboration.  Yeraldi Fidler, MD    Aviyon Hocevar S Anjuli Gemmill, MD 10/21/13 1309 

## 2013-10-21 NOTE — Discharge Instructions (Signed)
Medications as directed. Follow up with your PCP if symptoms do not improve over the next 5-7 days.  Bronchospasm, Adult A bronchospasm is a spasm or tightening of the airways going into the lungs. During a bronchospasm breathing becomes more difficult because the airways get smaller. When this happens there can be coughing, a whistling sound when breathing (wheezing), and difficulty breathing. Bronchospasm is often associated with asthma, but not all patients who experience a bronchospasm have asthma. CAUSES  A bronchospasm is caused by inflammation or irritation of the airways. The inflammation or irritation may be triggered by:   Allergies (such as to animals, pollen, food, or mold). Allergens that cause bronchospasm may cause wheezing immediately after exposure or many hours later.   Infection. Viral infections are believed to be the most common cause of bronchospasm.   Exercise.   Irritants (such as pollution, cigarette smoke, strong odors, aerosol sprays, and paint fumes).   Weather changes. Winds increase molds and pollens in the air. Rain refreshes the air by washing irritants out. Cold air may cause inflammation.   Stress and emotional upset.  SIGNS AND SYMPTOMS   Wheezing.   Excessive nighttime coughing.   Frequent or severe coughing with a simple cold.   Chest tightness.   Shortness of breath.  DIAGNOSIS  Bronchospasm is usually diagnosed through a history and physical exam. Tests, such as chest X-rays, are sometimes done to look for other conditions. TREATMENT   Inhaled medicines can be given to open up your airways and help you breathe. The medicines can be given using either an inhaler or a nebulizer machine.  Corticosteroid medicines may be given for severe bronchospasm, usually when it is associated with asthma. HOME CARE INSTRUCTIONS   Always have a plan prepared for seeking medical care. Know when to call your health care provider and local emergency  services (911 in the [place] Know where you can access local emergency care.  Only take medicines as directed by your health care provider.  If you were prescribed an inhaler or nebulizer machine, ask your health care provider to explain how to use it correctly. Always use a spacer with your inhaler if you were given one.  It is necessary to remain calm during an attack. Try to relax and breathe more slowly.  Control your home environment in the following ways:   Change your heating and air conditioning filter at least once a month.   Limit your use of fireplaces and wood stoves.  Do not smoke and do not allow smoking in your home.   Avoid exposure to perfumes and fragrances.   Get rid of pests (such as roaches and mice) and their droppings.   Throw away plants if you see mold on them.   Keep your house clean and dust free.   Replace carpet with wood, tile, or vinyl flooring. Carpet can trap dander and dust.   Use allergy-proof pillows, mattress covers, and box spring covers.   Wash bed sheets and blankets every week in hot water and dry them in a dryer.   Use blankets that are made of polyester or cotton.   Wash hands frequently. SEEK MEDICAL CARE IF:   You have muscle aches.   You have chest pain.   The sputum changes from clear or white to yellow, green, gray, or bloody.   The sputum you cough up gets thicker.   There are problems that may be related to the medicine you are given, such as a rash,  itching, swelling, or trouble breathing.  SEEK IMMEDIATE MEDICAL CARE IF:   You have worsening wheezing and coughing even after taking your prescribed medicines.   You have increased difficulty breathing.   You develop severe chest pain. MAKE SURE YOU:   Understand these instructions.  Will watch your condition.  Will get help right away if you are not doing well or get worse. Document Released: 05/30/2003 Document Revised: 01/27/2013 Document  Reviewed: 11/16/2012 [facility] Patient Information 2014 [place], [place].  Upper Respiratory Infection, Adult An upper respiratory infection (URI) is also sometimes known as the common cold. The upper respiratory tract includes the nose, sinuses, throat, trachea, and bronchi. Bronchi are the airways leading to the lungs. Most people improve within 1 week, but symptoms can last up to 2 weeks. A residual cough may last even longer.  CAUSES Many different viruses can infect the tissues lining the upper respiratory tract. The tissues become irritated and inflamed and often become very moist. Mucus production is also common. A cold is contagious. You can easily spread the virus to others by oral contact. This includes kissing, sharing a glass, coughing, or sneezing. Touching your mouth or nose and then touching a surface, which is then touched by another person, can also spread the virus. SYMPTOMS  Symptoms typically develop 1 to 3 days after you come in contact with a cold virus. Symptoms vary from person to person. They may include:  Runny nose.  Sneezing.  Nasal congestion.  Sinus irritation.  Sore throat.  Loss of voice (laryngitis).  Cough.  Fatigue.  Muscle aches.  Loss of appetite.  Headache.  Low-grade fever. DIAGNOSIS  You might diagnose your own cold based on familiar symptoms, since most people get a cold 2 to 3 times a year. Your caregiver can confirm this based on your exam. Most importantly, your caregiver can check that your symptoms are not due to another disease such as strep throat, sinusitis, pneumonia, asthma, or epiglottitis. Blood tests, throat tests, and X-rays are not necessary to diagnose a common cold, but they may sometimes be helpful in excluding other more serious diseases. Your caregiver will decide if any further tests are required. RISKS AND COMPLICATIONS  You may be at risk for a more severe case of the common cold if you smoke cigarettes, have chronic  heart disease (such as heart failure) or lung disease (such as asthma), or if you have a weakened immune system. The very young and very old are also at risk for more serious infections. Bacterial sinusitis, middle ear infections, and bacterial pneumonia can complicate the common cold. The common cold can worsen asthma and chronic obstructive pulmonary disease (COPD). Sometimes, these complications can require emergency medical care and may be life-threatening. PREVENTION  The best way to protect against getting a cold is to practice good hygiene. Avoid oral or hand contact with people with cold symptoms. Wash your hands often if contact occurs. There is no clear evidence that vitamin C, vitamin E, echinacea, or exercise reduces the chance of developing a cold. However, it is always recommended to get plenty of rest and practice good nutrition. TREATMENT  Treatment is directed at relieving symptoms. There is no cure. Antibiotics are not effective, because the infection is caused by a virus, not by bacteria. Treatment may include:  Increased fluid intake. Sports drinks offer valuable electrolytes, sugars, and fluids.  Breathing heated mist or steam (vaporizer or shower).  Eating chicken soup or other clear broths, and maintaining good nutrition.  Getting plenty of rest.  Using gargles or lozenges for comfort.  Controlling fevers with ibuprofen or acetaminophen as directed by your caregiver.  Increasing usage of your inhaler if you have asthma. Zinc gel and zinc lozenges, taken in the first 24 hours of the common cold, can shorten the duration and lessen the severity of symptoms. Pain medicines may help with fever, muscle aches, and throat pain. A variety of non-prescription medicines are available to treat congestion and runny nose. Your caregiver can make recommendations and may suggest nasal or lung inhalers for other symptoms.  HOME CARE INSTRUCTIONS   Only take over-the-counter or  prescription medicines for pain, discomfort, or fever as directed by your caregiver.  Use a warm mist humidifier or inhale steam from a shower to increase air moisture. This may keep secretions moist and make it easier to breathe.  Drink enough water and fluids to keep your urine clear or pale yellow.  Rest as needed.  Return to work when your temperature has returned to normal or as your caregiver advises. You may need to stay home longer to avoid infecting others. You can also use a face mask and careful hand washing to prevent spread of the virus. SEEK MEDICAL CARE IF:   After the first few days, you feel you are getting worse rather than better.  You need your caregiver's advice about medicines to control symptoms.  You develop chills, worsening shortness of breath, or brown or red sputum. These may be signs of pneumonia.  You develop yellow or brown nasal discharge or pain in the face, especially when you bend forward. These may be signs of sinusitis.  You develop a fever, swollen neck glands, pain with swallowing, or white areas in the back of your throat. These may be signs of strep throat. SEEK IMMEDIATE MEDICAL CARE IF:   You have a fever.  You develop severe or persistent headache, ear pain, sinus pain, or chest pain.  You develop wheezing, a prolonged cough, cough up blood, or have a change in your usual mucus (if you have chronic lung disease).  You develop sore muscles or a stiff neck. Document Released: 11/20/2000 Document Revised: 08/19/2011 Document Reviewed: 09/28/2010 [facility]ExitCare Patient Information 2014 [place]ExitCare, [place]LL[place].

## 2014-04-11 ENCOUNTER — Encounter (HOSPITAL_COMMUNITY): Payer: Self-pay | Admitting: Emergency Medicine

## 2014-07-10 IMAGING — US US OB DETAIL+14 WK
2 series · 12 of 28 positions shown · non-contrast
Comparison: none

[Series 1: us ob detail +14 wk · 2 of 15 slices shown (1 of 2)]
[im 4/15]
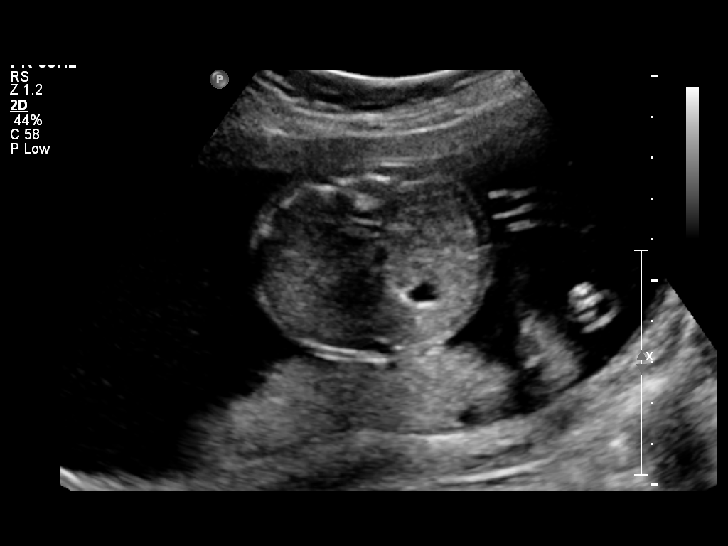
[im 11/15]
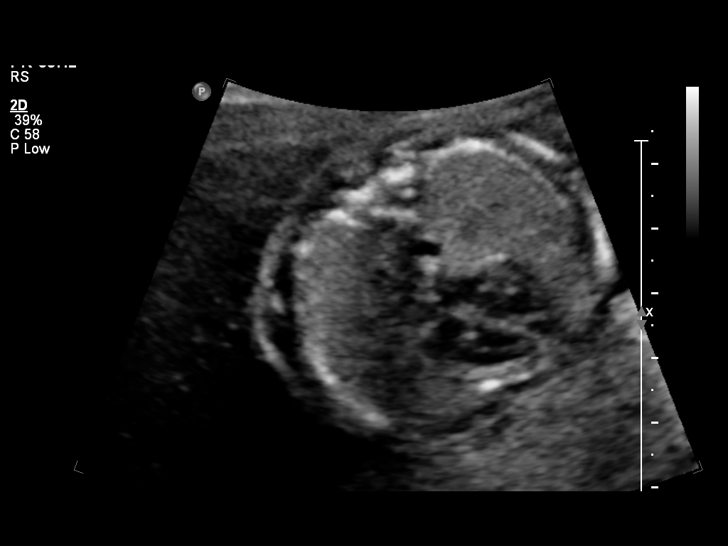

[Series 1: us ob detail +14 wk · 10 of 76 slices shown (2 of 2)]
[im 1/76]
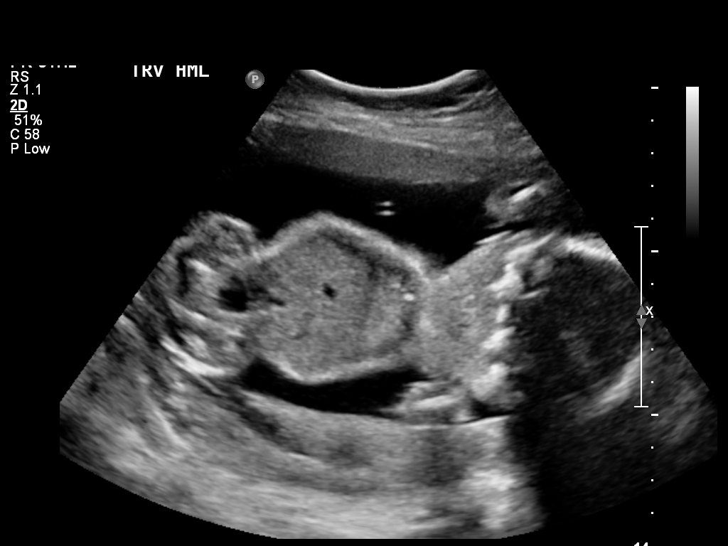
[im 11/76]
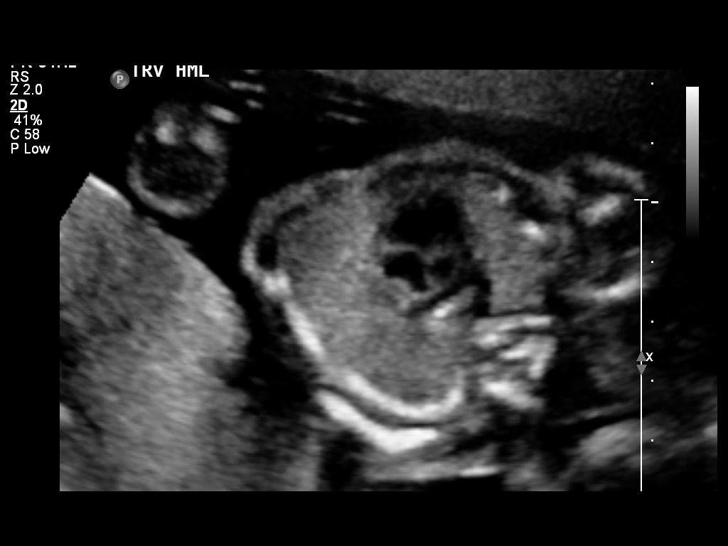
[im 18/76]
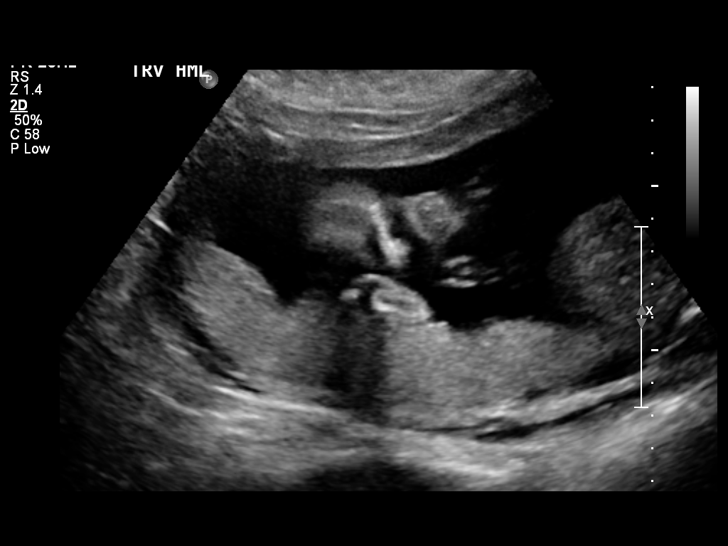
[im 24/76]
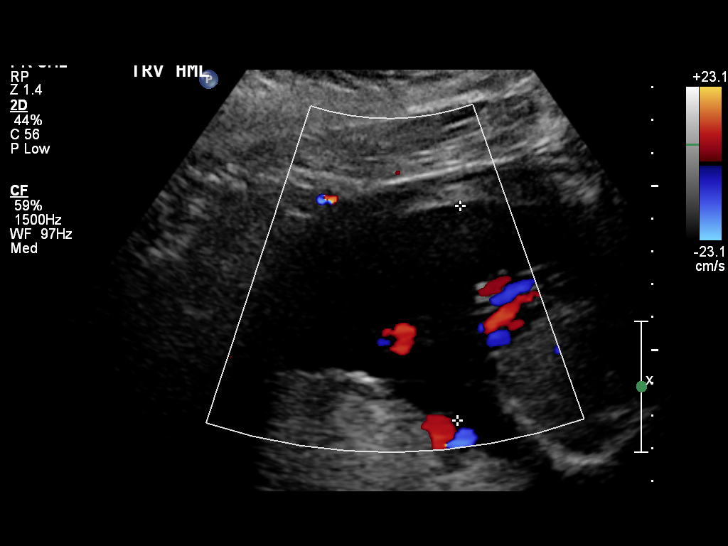
[im 35/76]
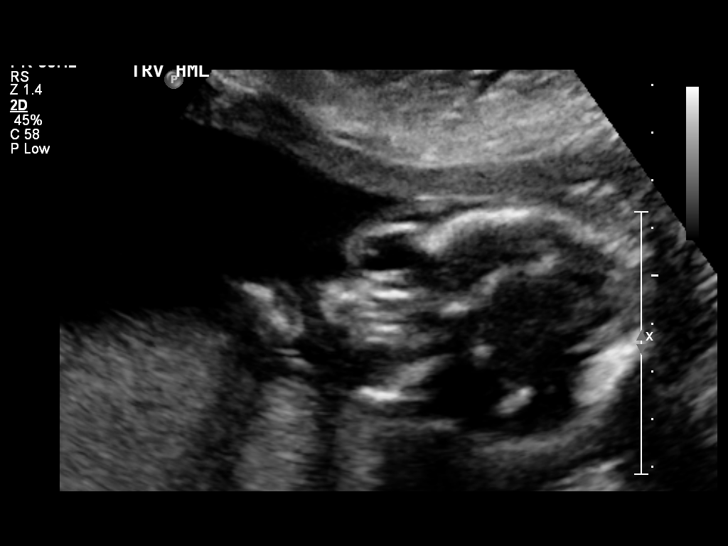
[im 41/76]
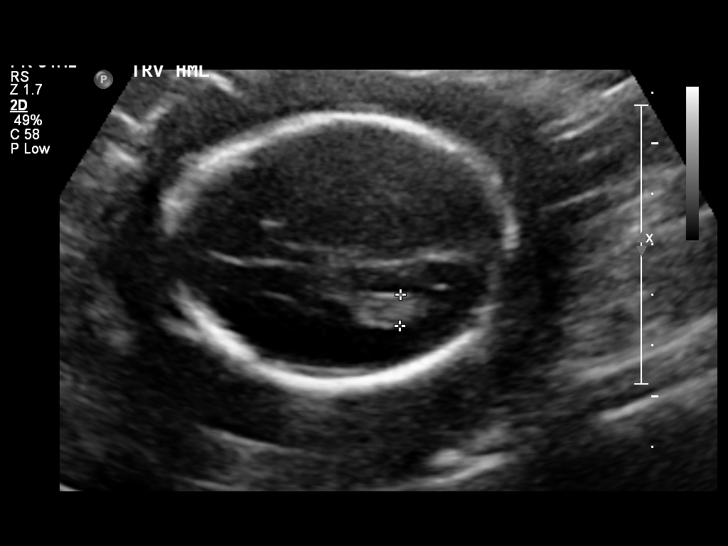
[im 48/76]
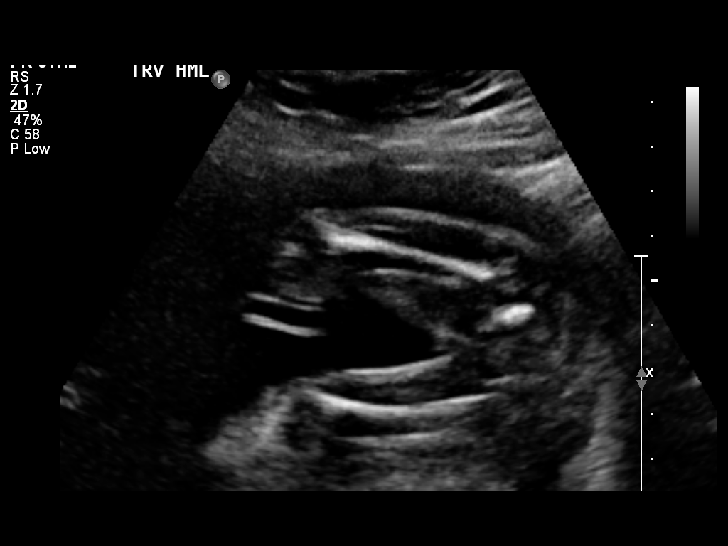
[im 58/76]
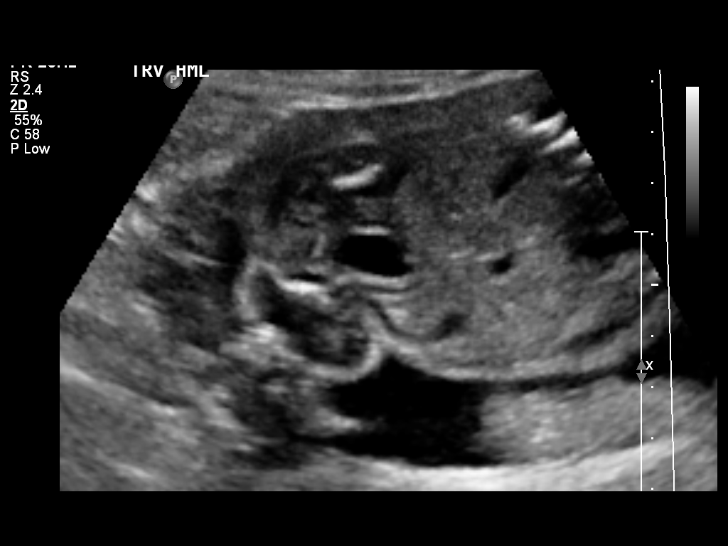
[im 65/76]
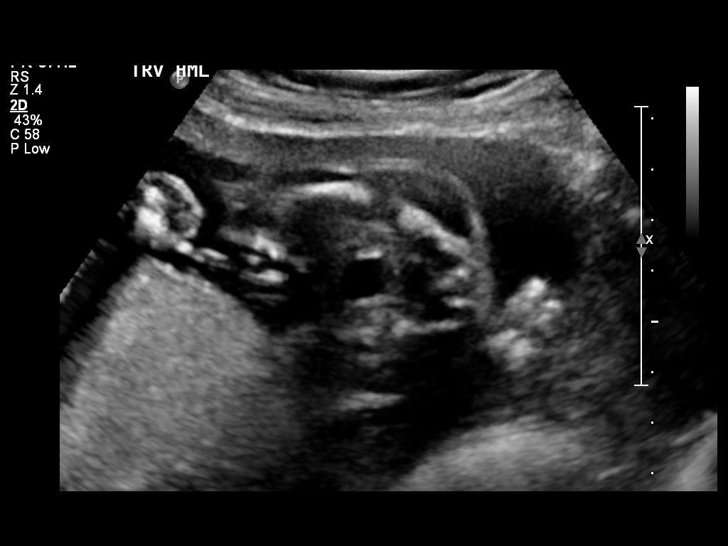
[im 72/76]
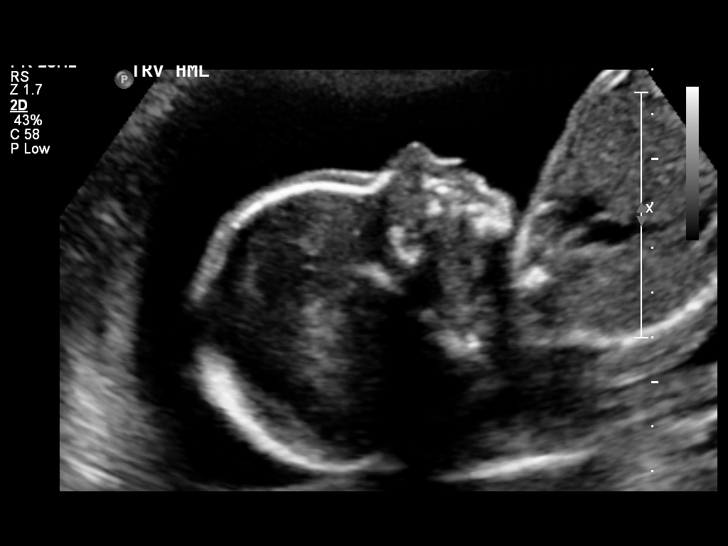

[12 of 28 positions shown; findings below may reference images not displayed]

OBSTETRICS REPORT
                      (Signed Final 02/06/2012 [DATE])

 Order#:         [id number]
Procedures

 US OB DETAIL + 14 WK                                  76811.0
Indications

 Detailed fetal anatomic survey
 Poor obstetric history: Previous gestational
 diabetes
 Poor obstetric history: Previous gestational HTN
Fetal Evaluation

 Fetal Heart Rate:  160                         bpm
 Cardiac Activity:  Observed
 Presentation:      Transverse, head to
                    maternal left
 Placenta:          Posterior, above cervical
                    os
 P. Cord            Visualized
 Insertion:

 Amniotic Fluid
 AFI FV:      Subjectively within normal limits
                                             Larg Pckt:     6.5  cm
Biometry

 BPD:     51.8  mm    G. Age:   21w 5d                CI:        69.35   70 - 86
                                                      FL/HC:      18.3   18.4 -

 HC:     198.6  mm    G. Age:   22w 0d       34  %    HC/AC:      1.06   1.06 -

 AC:     187.7  mm    G. Age:   23w 4d       83  %    FL/BPD:     70.1   71 - 87
 FL:      36.3  mm    G. Age:   21w 4d       21  %    FL/AC:      19.3   20 - 24
 HUM:     35.4  mm    G. Age:   22w 2d       50  %
 CER:     23.8  mm    G. Age:   21w 6d       45  %
 NFT:     3.67  mm

 Est. FW:     513  gm      1 lb 2 oz     54  %
Gestational Age

 LMP:           22w 1d       Date:   09/04/11                 EDD:   06/10/12
 U/S Today:     22w 1d                                        EDD:   06/10/12
 Best:          22w 1d    Det. By:   LMP  (09/04/11)          EDD:   06/10/12
Anatomy

 Cranium:           Appears normal      Aortic Arch:       Appears normal
 Fetal Cavum:       Appears normal      Ductal Arch:       Appears normal
 Ventricles:        Appears normal      Diaphragm:         Appears normal
 Choroid Plexus:    Appears normal      Stomach:           Appears
                                                           normal, left
                                                           sided
 Cerebellum:        Appears normal      Abdomen:           Appears normal
 Posterior Fossa:   Appears normal      Abdominal Wall:    Appears nml
                                                           (cord insert,
                                                           abd wall)
 Nuchal Fold:       Appears normal      Cord Vessels:      Appears normal
                    (neck, nuchal                          (3 vessel cord)
                    fold)
 Face:              Appears normal      Kidneys:           Appear normal
                    (lips/profile/orbit
                    s)
 Heart:             Appears normal      Bladder:           Appears normal
                    (4 chamber &
                    axis)
 RVOT:              Appears normal      Spine:             Appears normal
 LVOT:              Appears normal      Limbs:             Four extremities
                                                           seen

 Other:     Fetus appears to be a female. 5th digit visualized. Heels
            visualized. Nasal bone visualized. Technically difficult
            due to fetal position.
Targeted Anatomy

 Fetal Central Nervous System
 Lat. Ventricles:   6.2                 Cisterna Magna:
Cervix Uterus Adnexa

 Cervical Length:   3.2       cm

 Cervix:       Normal appearance by transabdominal scan.

 Left Ovary:   Size(cm) L: 3.97 x W: 2.44 x H: 1.29  Volume(cc):
 Right Ovary:  Size(cm) L: 3.38 x W: 2.14 x H: 2.07  Volume(cc):
 Adnexa:     No abnormality visualized.
Impression

 Siup demonstrating an EGA by ultrasound of 22w 1d. This
 corresponds well with expected EGA by LMP of 22w 1d.

 No focal fetal or placental abnormalities are noted with a
 good anatomic evaluation possible. No soft markers for Down
 Syndrome are seen. Correlation with other aneuploidy
 screening results, if available, would be recommended for a
 more complete risk assessment.

 Subjectively and quantitatively normal amniotic fluid volume.
 Normal cervical length.
 questions or concerns.

## 2015-08-25 ENCOUNTER — Other Ambulatory Visit (HOSPITAL_COMMUNITY)
Admission: RE | Admit: 2015-08-25 | Discharge: 2015-08-25 | Disposition: A | Discharge status: Home / Self Care | Payer: BLUE CROSS/BLUE SHIELD | Source: Ambulatory Visit | Attending: Physician Assistant | Admitting: Physician Assistant

## 2015-08-25 DIAGNOSIS — Z01419 Encounter for gynecological examination (general) (routine) without abnormal findings: Secondary | ICD-10-CM | POA: Insufficient documentation

## 2015-08-25 DIAGNOSIS — Z1151 Encounter for screening for human papillomavirus (HPV): Secondary | ICD-10-CM | POA: Insufficient documentation

## 2015-09-29 DIAGNOSIS — Z0000 Encounter for general adult medical examination without abnormal findings: Secondary | ICD-10-CM | POA: Diagnosis not present

## 2015-10-20 DIAGNOSIS — R7301 Impaired fasting glucose: Secondary | ICD-10-CM | POA: Diagnosis not present

## 2017-02-19 DIAGNOSIS — Z3009 Encounter for other general counseling and advice on contraception: Secondary | ICD-10-CM | POA: Diagnosis not present

## 2017-02-19 DIAGNOSIS — R51 Headache: Secondary | ICD-10-CM | POA: Diagnosis not present

## 2017-02-19 DIAGNOSIS — Z23 Encounter for immunization: Secondary | ICD-10-CM | POA: Diagnosis not present

## 2017-02-19 DIAGNOSIS — Z0000 Encounter for general adult medical examination without abnormal findings: Secondary | ICD-10-CM | POA: Diagnosis not present

## 2017-02-19 DIAGNOSIS — R7303 Prediabetes: Secondary | ICD-10-CM | POA: Diagnosis not present

## 2018-02-16 DIAGNOSIS — R51 Headache: Secondary | ICD-10-CM | POA: Diagnosis not present

## 2018-02-16 DIAGNOSIS — J069 Acute upper respiratory infection, unspecified: Secondary | ICD-10-CM | POA: Diagnosis not present

## 2018-02-16 DIAGNOSIS — R05 Cough: Secondary | ICD-10-CM | POA: Diagnosis not present

## 2018-03-19 DIAGNOSIS — Z0000 Encounter for general adult medical examination without abnormal findings: Secondary | ICD-10-CM | POA: Diagnosis not present

## 2018-03-19 DIAGNOSIS — R7303 Prediabetes: Secondary | ICD-10-CM | POA: Diagnosis not present

## 2018-03-19 DIAGNOSIS — Z23 Encounter for immunization: Secondary | ICD-10-CM | POA: Diagnosis not present

## 2018-06-26 DIAGNOSIS — E559 Vitamin D deficiency, unspecified: Secondary | ICD-10-CM | POA: Diagnosis not present

## 2018-06-26 DIAGNOSIS — Z6839 Body mass index (BMI) 39.0-39.9, adult: Secondary | ICD-10-CM | POA: Diagnosis not present

## 2018-06-26 DIAGNOSIS — E118 Type 2 diabetes mellitus with unspecified complications: Secondary | ICD-10-CM | POA: Diagnosis not present

## 2018-10-13 ENCOUNTER — Encounter (HOSPITAL_COMMUNITY): Payer: Self-pay

## 2018-10-13 ENCOUNTER — Ambulatory Visit (HOSPITAL_COMMUNITY)
Admission: EM | Admit: 2018-10-13 | Discharge: 2018-10-13 | Disposition: A | Discharge status: Home / Self Care | Payer: BLUE CROSS/BLUE SHIELD | Attending: Family Medicine | Admitting: Family Medicine

## 2018-10-13 ENCOUNTER — Other Ambulatory Visit: Payer: Self-pay

## 2018-10-13 DIAGNOSIS — R110 Nausea: Secondary | ICD-10-CM | POA: Diagnosis not present

## 2018-10-13 DIAGNOSIS — R109 Unspecified abdominal pain: Secondary | ICD-10-CM | POA: Insufficient documentation

## 2018-10-13 DIAGNOSIS — Z87442 Personal history of urinary calculi: Secondary | ICD-10-CM | POA: Diagnosis not present

## 2018-10-13 LAB — BASIC METABOLIC PANEL
Anion gap: 9 (ref 5–15)
BUN: 5 mg/dL — ABNORMAL LOW (ref 6–20)
CO2: 27 mmol/L (ref 22–32)
Calcium: 9 mg/dL (ref 8.9–10.3)
Chloride: 104 mmol/L (ref 98–111)
Creatinine, Ser: 0.66 mg/dL (ref 0.44–1.00)
GFR calc Af Amer: >60 mL/min (ref >60)
GFR calc non Af Amer: >60 mL/min (ref >60)
Glucose, Bld: 90 mg/dL (ref 70–99)
Potassium: 3.8 mmol/L (ref 3.5–5.1)
Sodium: 140 mmol/L (ref 135–145)

## 2018-10-13 LAB — POCT URINALYSIS DIP (DEVICE)
Bilirubin Urine: NEGATIVE
Glucose, UA: 100 mg/dL — AB
Ketones, ur: NEGATIVE mg/dL
Nitrite: NEGATIVE
Protein, ur: NEGATIVE mg/dL
Specific Gravity, Urine: >=1.03 (ref 1.005–1.030)
Urobilinogen, UA: 0.2 mg/dL (ref 0.0–1.0)
pH: 5.5 (ref 5.0–8.0)

## 2018-10-13 MED ORDER — TAMSULOSIN HCL 0.4 MG PO CAPS: 0.4000 mg | ORAL_CAPSULE | Freq: Every day | ORAL | 0 refills | Status: DC

## 2018-10-13 MED ORDER — HYDROCODONE-ACETAMINOPHEN 5-325 MG PO TABS: 1.0000 | ORAL_TABLET | Freq: Four times a day (QID) | ORAL | 0 refills | Status: DC | PRN

## 2018-10-13 NOTE — ED Provider Notes (Signed)
[facility]   [id number] 10/13/18 Arrival Time: 1130  ASSESSMENT & PLAN:  1. Left flank pain    Benign abdominal/back exam. No indications for urgent abdominal/pelvic imaging at this time. Discussed likelihood of nephrolithiasis given h/o similar with similar pain/symptoms. Agrees to proceed to ED for CT if pain worsens. Reports last kidney stone passed after starting Flomax. Ensure adequate fluid intake. Declines Rx Zofran.  Meds ordered this encounter  Medications  . HYDROcodone-acetaminophen (NORCO/VICODIN) 5-325 MG tablet    Sig: Take 1 tablet by mouth every 6 (six) hours as needed for moderate pain or severe pain.    Dispense:  8 tablet    Refill:  0  . tamsulosin (FLOMAX) 0.4 MG CAPS capsule    Sig: Take 1 capsule (0.4 mg total) by mouth daily.    Dispense:  10 capsule    Refill:  0   [place] Controlled Substances Registry consulted for this patient. I feel the risk/benefit ratio today is favorable for proceeding with this prescription for a controlled substance. Medication sedation precautions given.   Discharge Instructions     Be aware, pain medications may cause drowsiness. Please do not drive, operate heavy machinery or make important decisions while on this medication, it can cloud your judgement.      Follow-up Information    Aliene Beams, MD.   Specialty:  Family Medicine Why:  As needed. Contact information: [address] [place] [address] [phone]        [facility] EMERGENCY DEPARTMENT.   Specialty:  Emergency Medicine Why:  Should your symptoms/pain worsen. Contact information: [address] [id number] mc [place] [address] [phone]         Reviewed expectations re: course of current medical issues. Questions answered. Outlined signs and symptoms indicating need for more acute intervention. Patient verbalized understanding. After Visit Summary given.   SUBJECTIVE: History  from: patient. Megan Barry is a 38 y.o. female who was sent here after e-visit this morning with PCP. Reports  intermittent left flank pain. Onset gradual, noticed approx 5 days ago. Discomfort described as colicky and sharp when present; with slight radiation to her left side. Symptoms are gradually worsening since beginning. Fever: unsure; "have felt chilled". Aggravating factors: have not been identified. Alleviating factors: have not been identified. Associated symptoms: mild fatigue. She denies anorexia, constipation, diarrhea, dysuria, headache, myalgias and sweats. Appetite: decreased. PO intake: decreased. Ambulatory without assistance. Urinary symptoms: none; currently on her menstrual period. Bowel movements: have not significantly changed; last bowel movement within the past 1-2 days and without blood. History of similar: kidney stone 5-6 years ago with similar symptoms. OTC treatment: ibuprofen 600mg  without much help.  Patient's last menstrual period was 10/06/2018.   Past Surgical History:  Procedure Laterality Date  . VAGINAL DELIVERY     X 2   ROS: As per HPI. All other systems negative.  OBJECTIVE:  Vitals:   10/13/18 1155  BP: 125/77  Pulse: 94  Resp: 20  Temp: 98.4 F (36.9 C)  SpO2: 97%    General appearance: alert, oriented, no acute distress; does appear to be in pain HEENT: oropharynx moist Neck: supple Lungs: clear to auscultation bilaterally; unlabored respirations Heart: regular rate and rhythm Abdomen: soft; without distention; no specific tenderness to palpation; normal bowel sounds; without masses or organomegaly; without guarding or rebound tenderness Back: with L CVA tenderness; FROM at waist Extremities: without LE edema; symmetrical; without gross deformities Skin: warm and dry Neurologic: normal  gait Psychological: alert and cooperative; normal mood and affect  Labs: Results for orders placed or performed during the hospital encounter of  10/13/18  Basic metabolic panel  Result Value Ref Range   Sodium 140 135 - 145 mmol/L   Potassium 3.8 3.5 - 5.1 mmol/L   Chloride 104 98 - 111 mmol/L   CO2 27 22 - 32 mmol/L   Glucose, Bld 90 70 - 99 mg/dL   BUN 5 (L) 6 - 20 mg/dL   Creatinine, Ser 1.610.66 0.44 - 1.00 mg/dL   Calcium 9.0 8.9 - 09.610.3 mg/dL   GFR calc non Af Amer >60 >60 mL/min   GFR calc Af Amer >60 >60 mL/min   Anion gap 9 5 - 15  POCT urinalysis dip (device)  Result Value Ref Range   Glucose, UA 100 (A) NEGATIVE mg/dL   Bilirubin Urine NEGATIVE NEGATIVE   Ketones, ur NEGATIVE NEGATIVE mg/dL   Specific Gravity, Urine >=1.030 1.005 - 1.030   Hgb urine dipstick MODERATE (A) NEGATIVE   pH 5.5 5.0 - 8.0   Protein, ur NEGATIVE NEGATIVE mg/dL   Urobilinogen, UA 0.2 0.0 - 1.0 mg/dL   Nitrite NEGATIVE NEGATIVE   Leukocytes,Ua TRACE (A) NEGATIVE    No Known Allergies                                             Past Medical History:  Diagnosis Date  . Gestational diabetes    insulin   Social History   Socioeconomic History  . Marital status: Married    Spouse name: Not on file  . Number of children: Not on file  . Years of education: Not on file  . Highest education level: Not on file  Occupational History  . Not on file  Social Needs  . Financial resource strain: Not on file  . Food insecurity:    Worry: Not on file    Inability: Not on file  . Transportation needs:    Medical: Not on file    Non-medical: Not on file  Tobacco Use  . Smoking status: Never Smoker  . Smokeless tobacco: Never Used  Substance and Sexual Activity  . Alcohol use: No  . Drug use: No  . Sexual activity: Yes  Lifestyle  . Physical activity:    Days per week: Not on file    Minutes per session: Not on file  . Stress: Not on file  Relationships  . Social connections:    Talks on phone: Not on file    Gets together: Not on file    Attends religious service: Not on file    Active member of club or organization: Not on file     Attends meetings of clubs or organizations: Not on file    Relationship status: Not on file  . Intimate partner violence:    Fear of current or ex partner: Not on file    Emotionally abused: Not on file    Physically abused: Not on file    Forced sexual activity: Not on file  Other Topics Concern  . Not on file  Social History Narrative  . Not on file   Family History  Problem Relation Age of Onset  . Diabetes Mother   . Hypertension Mother      Mardella LaymanHagler, Emily Forse, MD 10/13/18 [phone]

## 2018-10-13 NOTE — ED Triage Notes (Signed)
Pt states that she has left sided flank pain that began last week and has gotten worse, also describes lower abdominal pain, pt  States she has had kidney Ferris Tally in past and it feels the same

## 2018-10-13 NOTE — Discharge Instructions (Signed)
Be aware, pain medications may cause drowsiness. Please do not drive, operate heavy machinery or make important decisions while on this medication, it can cloud your judgement.  

## 2018-10-14 LAB — URINE CULTURE: Culture: NO GROWTH

## 2018-10-15 ENCOUNTER — Telehealth (HOSPITAL_COMMUNITY): Payer: Self-pay | Admitting: Emergency Medicine

## 2018-10-15 NOTE — Telephone Encounter (Signed)
Patient contacted and made aware of all results, all questions answered. Normal labs, normal urine, suspicious of kidney stones. Will follow up if not improving or gets worse.

## 2019-04-16 DIAGNOSIS — E118 Type 2 diabetes mellitus with unspecified complications: Secondary | ICD-10-CM | POA: Diagnosis not present

## 2019-04-16 DIAGNOSIS — Z23 Encounter for immunization: Secondary | ICD-10-CM | POA: Diagnosis not present

## 2019-04-16 DIAGNOSIS — E559 Vitamin D deficiency, unspecified: Secondary | ICD-10-CM | POA: Diagnosis not present

## 2019-04-16 DIAGNOSIS — E669 Obesity, unspecified: Secondary | ICD-10-CM | POA: Diagnosis not present

## 2019-04-16 DIAGNOSIS — E782 Mixed hyperlipidemia: Secondary | ICD-10-CM | POA: Diagnosis not present

## 2019-04-16 DIAGNOSIS — Z0000 Encounter for general adult medical examination without abnormal findings: Secondary | ICD-10-CM | POA: Diagnosis not present

## 2019-08-25 DIAGNOSIS — N390 Urinary tract infection, site not specified: Secondary | ICD-10-CM | POA: Diagnosis not present

## 2019-08-25 DIAGNOSIS — R300 Dysuria: Secondary | ICD-10-CM | POA: Diagnosis not present

## 2019-09-02 ENCOUNTER — Other Ambulatory Visit: Payer: Self-pay | Admitting: Physician Assistant

## 2019-09-02 ENCOUNTER — Ambulatory Visit
Admission: RE | Admit: 2019-09-02 | Discharge: 2019-09-02 | Disposition: A | Discharge status: Home / Self Care | Payer: BLUE CROSS/BLUE SHIELD | Source: Ambulatory Visit | Attending: Physician Assistant | Admitting: Physician Assistant

## 2019-09-02 DIAGNOSIS — R319 Hematuria, unspecified: Secondary | ICD-10-CM | POA: Diagnosis not present

## 2019-09-02 DIAGNOSIS — N201 Calculus of ureter: Secondary | ICD-10-CM | POA: Diagnosis not present

## 2019-09-03 ENCOUNTER — Other Ambulatory Visit: Payer: Self-pay | Admitting: Physician Assistant

## 2019-09-03 DIAGNOSIS — R935 Abnormal findings on diagnostic imaging of other abdominal regions, including retroperitoneum: Secondary | ICD-10-CM

## 2019-09-21 ENCOUNTER — Ambulatory Visit
Admission: RE | Admit: 2019-09-21 | Discharge: 2019-09-21 | Disposition: A | Discharge status: Home / Self Care | Payer: BC Managed Care – PPO | Source: Ambulatory Visit | Attending: Physician Assistant | Admitting: Physician Assistant

## 2019-09-21 DIAGNOSIS — N200 Calculus of kidney: Secondary | ICD-10-CM | POA: Diagnosis not present

## 2019-09-21 DIAGNOSIS — R935 Abnormal findings on diagnostic imaging of other abdominal regions, including retroperitoneum: Secondary | ICD-10-CM

## 2019-09-21 DIAGNOSIS — K8020 Calculus of gallbladder without cholecystitis without obstruction: Secondary | ICD-10-CM | POA: Diagnosis not present

## 2019-12-20 ENCOUNTER — Ambulatory Visit (HOSPITAL_COMMUNITY)
Admission: EM | Admit: 2019-12-20 | Discharge: 2019-12-20 | Disposition: A | Discharge status: Home / Self Care | Payer: BC Managed Care – PPO | Attending: Family Medicine | Admitting: Family Medicine

## 2019-12-20 ENCOUNTER — Other Ambulatory Visit: Payer: Self-pay

## 2019-12-20 ENCOUNTER — Encounter (HOSPITAL_COMMUNITY): Payer: Self-pay

## 2019-12-20 DIAGNOSIS — N390 Urinary tract infection, site not specified: Secondary | ICD-10-CM | POA: Diagnosis not present

## 2019-12-20 DIAGNOSIS — R109 Unspecified abdominal pain: Secondary | ICD-10-CM

## 2019-12-20 DIAGNOSIS — N200 Calculus of kidney: Secondary | ICD-10-CM | POA: Insufficient documentation

## 2019-12-20 DIAGNOSIS — R319 Hematuria, unspecified: Secondary | ICD-10-CM | POA: Diagnosis not present

## 2019-12-20 LAB — POCT URINALYSIS DIP (DEVICE)
Bilirubin Urine: NEGATIVE
Glucose, UA: NEGATIVE mg/dL
Ketones, ur: NEGATIVE mg/dL
Leukocytes,Ua: NEGATIVE
Nitrite: NEGATIVE
Protein, ur: NEGATIVE mg/dL
Specific Gravity, Urine: <=1.005 (ref 1.005–1.030)
Urobilinogen, UA: 0.2 mg/dL (ref 0.0–1.0)
pH: 6 (ref 5.0–8.0)

## 2019-12-20 MED ORDER — OXYCODONE-ACETAMINOPHEN 5-325 MG PO TABS: 1.0000 | ORAL_TABLET | Freq: Four times a day (QID) | ORAL | 0 refills | Status: DC | PRN

## 2019-12-20 MED ORDER — TAMSULOSIN HCL 0.4 MG PO CAPS: 0.4000 mg | ORAL_CAPSULE | Freq: Every day | ORAL | 0 refills | Status: DC

## 2019-12-20 MED ORDER — ONDANSETRON 4 MG PO TBDP: 4.0000 mg | ORAL_TABLET | Freq: Three times a day (TID) | ORAL | 0 refills | Status: DC | PRN

## 2019-12-20 NOTE — ED Triage Notes (Signed)
Pt presents with recurring bilateral flank pain, nausea, urinary frequency and urinary urgency since waking up today.

## 2019-12-20 NOTE — ED Provider Notes (Signed)
[facility]    CSN: [id number] Arrival date & time: 12/20/19  1328      History   Chief Complaint Chief Complaint  Patient presents with  . Urinary Tract Infection  . Flank Pain    HPI Megan Barry is a 39 y.o. female.   HPI   Patient has a history of kidney stone So she had a CT scan performed in April 2021 that showed that she still had kidney stones in both kidneys She is here today with flank pain, nausea, urinary frequency, and urgency just since waking up today Initially the pain was in her left flank, then it moved to her lower abdomen, now she states it feels like it is in her bladder.  She feels like she needs to urinate but only goes small amount.  She is worried there is a stone stuck in her urethra. She has not seen any hematuria She has not had a fever and chills  she has had some nausea with no vomiting  p Past Medical History:  Diagnosis Date  . Gestational diabetes    insulin    Patient Active Problem List   Diagnosis Date Noted  . Diabetes mellitus, antepartum(648.03) 05/20/2012  . Preeclampsia 05/20/2012  . Normal delivery 05/20/2012    Past Surgical History:  Procedure Laterality Date  . VAGINAL DELIVERY     X 2    OB History    Gravida  4   Para  3   Term  3   Preterm      AB  1   Living  3     SAB  1   TAB      Ectopic      Multiple      Live Births  3            Home Medications    Prior to Admission medications   Medication Sig Start Date End Date Taking? Authorizing Provider  ondansetron (ZOFRAN-ODT) 4 MG disintegrating tablet Take 1 tablet (4 mg total) by mouth every 8 (eight) hours as needed for nausea or vomiting. 12/20/19   Eustace Moore, MD  oxyCODONE-acetaminophen (PERCOCET/ROXICET) 5-325 MG tablet Take 1-2 tablets by mouth every 6 (six) hours as needed for severe pain. 12/20/19   Eustace Moore, MD  Prenatal Vit-Fe Fumarate-FA (PRENATAL MULTIVITAMIN) TABS Take 1 tablet by mouth daily.     [provider]  tamsulosin (FLOMAX) 0.4 MG CAPS capsule Take 1 capsule (0.4 mg total) by mouth daily. 12/20/19   Eustace Moore, MD  albuterol (PROVENTIL HFA;VENTOLIN HFA) 108 (90 BASE) MCG/ACT inhaler Inhale 1-2 puffs into the lungs every 6 (six) hours as needed for wheezing or shortness of breath. 10/21/13 12/20/19  Presson, Mathis Fare, PA    Family History Family History  Problem Relation Age of Onset  . Diabetes Mother   . Hypertension Mother     Social History Social History   Tobacco Use  . Smoking status: Never Smoker  . Smokeless tobacco: Never Used  Substance Use Topics  . Alcohol use: No  . Drug use: No     Allergies   Patient has no known allergies.   Review of Systems Review of Systems See HPI  Physical Exam Triage Vital Signs ED Triage Vitals  Enc Vitals Group     BP 12/20/19 1459 119/84     Pulse Rate 12/20/19 1459 89     Resp 12/20/19 1459 18     Temp  12/20/19 1459 98.6 F (37 C)     Temp Source 12/20/19 1459 Oral     SpO2 12/20/19 1459 100 %     Weight --      Height --      Head Circumference --      Peak Flow --      Pain Score 12/20/19 1457 8     Pain Loc --      Pain Edu? --      Excl. in GC? --    No data found.  Updated Vital Signs BP 119/84 (BP Location: Right Arm)   Pulse 89   Temp 98.6 F (37 C) (Oral)   Resp 18   LMP 12/10/2019   SpO2 100%  :     Physical Exam Constitutional:      General: She is not in acute distress.    Appearance: She is well-developed.     Comments: Overweight.  Uncomfortable  HENT:     Head: Normocephalic and atraumatic.     Mouth/Throat:     Comments: Mask is in place Eyes:     Conjunctiva/sclera: Conjunctivae normal.     Pupils: Pupils are equal, round, and reactive to light.  Cardiovascular:     Rate and Rhythm: Normal rate and regular rhythm.  Pulmonary:     Effort: Pulmonary effort is normal. No respiratory distress.     Breath sounds: Normal breath sounds.    Abdominal:     General: There is no distension.     Palpations: Abdomen is soft.     Tenderness: There is no abdominal tenderness. There is no right CVA tenderness or left CVA tenderness.  Musculoskeletal:        General: Normal range of motion.     Cervical back: Normal range of motion.  Skin:    General: Skin is warm and dry.  Neurological:     Mental Status: She is alert.  Psychiatric:        Mood and Affect: Mood normal.        Behavior: Behavior normal.      UC Treatments / Results  Labs (all labs ordered are listed, but only abnormal results are displayed) Labs Reviewed  POCT URINALYSIS DIP (DEVICE) - Abnormal; Notable for the following components:      Result Value   Hgb urine dipstick MODERATE (*)    All other components within normal limits  URINE CULTURE    EKG   Radiology No results found.  Procedures Procedures (including critical care time)  Medications Ordered in UC Medications - No data to display  Initial Impression / Assessment and Plan / UC Course  I have reviewed the triage vital signs and the nursing notes.  Pertinent labs & imaging results that were available during my care of the patient were reviewed by me and considered in my medical decision making (see chart for details).     Discussed kidney stones.  Discussed conservative care.  Need to go the ER she feels conservative management Final Clinical Impressions(s) / UC Diagnoses   Final diagnoses:  Hematuria, unspecified type  Nephrolithiasis     Discharge Instructions     Take the pain medicine as needed Take zofran if needed nausea Take the flomax once a day Drink lots of water  GO TO ER if worse   ED Prescriptions    Medication Sig Dispense Auth. Provider   ondansetron (ZOFRAN-ODT) 4 MG disintegrating tablet Take 1 tablet (4 mg total) by mouth  every 8 (eight) hours as needed for nausea or vomiting. 20 tablet Eustace Moore, MD   oxyCODONE-acetaminophen  (PERCOCET/ROXICET) 5-325 MG tablet Take 1-2 tablets by mouth every 6 (six) hours as needed for severe pain. 20 tablet Eustace Moore, MD   tamsulosin (FLOMAX) 0.4 MG CAPS capsule Take 1 capsule (0.4 mg total) by mouth daily. 15 capsule Eustace Moore, MD     I have reviewed the PDMP during this encounter.   Eustace Moore, MD 12/20/19 1734

## 2019-12-20 NOTE — Discharge Instructions (Signed)
Take the pain medicine as needed Take zofran if needed nausea Take the flomax once a day Drink lots of water  GO TO ER if worse

## 2019-12-22 LAB — URINE CULTURE: Special Requests: NORMAL

## 2020-02-11 ENCOUNTER — Other Ambulatory Visit: Payer: Self-pay

## 2020-02-11 ENCOUNTER — Inpatient Hospital Stay (HOSPITAL_COMMUNITY)
Admission: AD | Admit: 2020-02-11 | Discharge: 2020-02-11 | Disposition: A | Discharge status: Home / Self Care | Payer: BC Managed Care – PPO | Attending: Obstetrics and Gynecology | Admitting: Obstetrics and Gynecology

## 2020-02-11 DIAGNOSIS — N920 Excessive and frequent menstruation with regular cycle: Secondary | ICD-10-CM | POA: Insufficient documentation

## 2020-02-11 LAB — POCT PREGNANCY, URINE: Preg Test, Ur: NEGATIVE

## 2020-02-11 MED ORDER — MEGESTROL ACETATE 40 MG PO TABS: 40.0000 mg | ORAL_TABLET | Freq: Two times a day (BID) | ORAL | 1 refills | Status: DC

## 2020-02-11 NOTE — Discharge Instructions (Signed)
Call [facility]: [phone]  Or try Vernon Prey, Chief Technology Officer, [facility], [facility]   There are many options for quick evaluation and management of certain GYN issues that do not require a long wait time or big bill from the emergency department. Consider these options for your care in the future:   Walk-ins for certain complaints available at:   [facility] [address] [phone] See hours at [email or web address]  [facility] for [employer] [facility] [employer] [facility]  [address]  [phone]  [facility] for [employer] at [employer] [phone]  You can make an appointment to see a GYN provider:   [facility] for [facility] at [facility]  [address] [phone]  Center for [facility] at [facility] [address] [employer] [phone]  Center for [facility] at [facility] [place] [phone]  Center for [facility] at [facility] [address] [phone]  If you already have an established OB/GYN provider in the area you can make an appointment with them as well.   Medicines:  -take megace twice a day

## 2020-02-11 NOTE — MAU Provider Note (Signed)
Patient Megan Barry is a 39 y.o. [phone] at Unknown here with complaints of an episode of heavy bleeding. She reports that she felt tender, exhausted, back ache and thought that she was pregnant. Her period was a week late (it started on 02/02/2020) and has been heavy at times, lasting longer than usual. Her period is usually 7 days and right now it is lasting 10 days. She denies fever, SOB, she feels a little weak. She is not changing frequent pads but does  report clots   She gets regular pap smears.   She has regular check ups at [facility].   Pregnancy test is negative in MAU.   Patient Vitals for the past 24 hrs:  BP Temp Temp src Pulse Resp SpO2 Height Weight  02/11/20 1626 140/77 97.7 F (36.5 C) Oral 99 18 100 % -- --  02/11/20 1621 -- -- -- -- -- -- 5\' 6"  (1.676 m) 109.8 kg     Assessment and Plan   1. Menorrhagia with regular cycle    Discussed with patient that irregular period is not an emergency and her condition is best managed with by a gyn provider outpatient. Reassured patient that her period can change as she ages; her outpatient work-up can investigate further causes.  Patient given RX for Megace for one month, but patient says she will wait and see what happens. Reviewed warning signs and when to return to MAU. All questions answered.    Asaf Elmquist 02/11/2020, 4:57 PM

## 2020-02-11 NOTE — MAU Note (Signed)
Presents with c/o VB last Saturday.  States had heavy VB and passing clots.  Didn't take HPT, unsure if ever pregnant.

## 2020-02-17 DIAGNOSIS — E118 Type 2 diabetes mellitus with unspecified complications: Secondary | ICD-10-CM | POA: Diagnosis not present

## 2020-02-17 DIAGNOSIS — E669 Obesity, unspecified: Secondary | ICD-10-CM | POA: Diagnosis not present

## 2020-02-17 DIAGNOSIS — L659 Nonscarring hair loss, unspecified: Secondary | ICD-10-CM | POA: Diagnosis not present

## 2020-02-17 DIAGNOSIS — E559 Vitamin D deficiency, unspecified: Secondary | ICD-10-CM | POA: Diagnosis not present

## 2020-02-17 DIAGNOSIS — E782 Mixed hyperlipidemia: Secondary | ICD-10-CM | POA: Diagnosis not present

## 2020-05-03 DIAGNOSIS — N200 Calculus of kidney: Secondary | ICD-10-CM | POA: Diagnosis not present

## 2020-05-03 DIAGNOSIS — E559 Vitamin D deficiency, unspecified: Secondary | ICD-10-CM | POA: Diagnosis not present

## 2020-05-03 DIAGNOSIS — Z23 Encounter for immunization: Secondary | ICD-10-CM | POA: Diagnosis not present

## 2020-05-03 DIAGNOSIS — Z0000 Encounter for general adult medical examination without abnormal findings: Secondary | ICD-10-CM | POA: Diagnosis not present

## 2020-05-03 DIAGNOSIS — R310 Gross hematuria: Secondary | ICD-10-CM | POA: Diagnosis not present

## 2020-05-22 ENCOUNTER — Other Ambulatory Visit: Payer: BC Managed Care – PPO

## 2020-05-22 ENCOUNTER — Other Ambulatory Visit: Payer: Self-pay

## 2020-05-22 DIAGNOSIS — Z20822 Contact with and (suspected) exposure to covid-19: Secondary | ICD-10-CM

## 2020-05-23 LAB — NOVEL CORONAVIRUS, NAA: SARS-CoV-2, NAA: NOT DETECTED

## 2020-05-23 LAB — SARS-COV-2, NAA 2 DAY TAT

## 2020-06-07 ENCOUNTER — Other Ambulatory Visit (HOSPITAL_COMMUNITY): Payer: Self-pay

## 2021-07-11 ENCOUNTER — Other Ambulatory Visit: Payer: No Typology Code available for payment source

## 2021-07-11 ENCOUNTER — Inpatient Hospital Stay: Payer: No Typology Code available for payment source | Attending: Obstetrics and Gynecology | Admitting: *Deleted

## 2021-07-11 ENCOUNTER — Other Ambulatory Visit: Payer: Self-pay

## 2021-07-11 VITALS — BP 130/92 | Ht 66.0 in | Wt 229.5 lb

## 2021-07-11 DIAGNOSIS — Z0000 Encounter for general adult medical examination without abnormal findings: Secondary | ICD-10-CM

## 2021-07-11 NOTE — Progress Notes (Signed)
Wisewoman initial screening    Clinical Measurement: There were no vitals filed for this visit. Fasting Labs Drawn Today, will review with patient when they result.   Medical History:  Patient states that she  does not know if she has  high cholesterol, does not have high blood pressure and she has diabetes.  Medications:  Patient states that she does not take medication to lower cholesterol, blood pressure or blood sugar.  Patient does not take an aspirin a day to help prevent a heart attack or stroke.    Blood pressure, self measurement: Patient states that she does not measure blood pressure from home. She checks her blood pressure N/A. She shares her readings with a health care provider: N/A.   Nutrition: Patient states that on average she eats 1 cups of fruit and 0 cups of vegetables per day. Patient states that she does eat fish at least 2 times per week. Patient eats more than half servings of whole grains. Patient drinks less than 36 ounces of beverages with added sugar weekly: yes. Patient is currently watching sodium or salt intake: yes. In the past 7 days patient has consumed drinks containing alcohol on 0 days. On a day that patient consumes drinks containing alcohol on average 2 drinks are consumed.      Physical activity:  Patient states that she gets 0 minutes of moderate and 0 minutes of vigorous physical activity each week.  Smoking status:  Patient states that she has has never smoked .   Quality of life:  Over the past 2 weeks patient states that she had little interest or pleasure in doing things: not at all. She has been feeling down, depressed or hopeless:not at all.    Risk reduction and counseling:   Health Coaching: Patient states that she has a hx of diabetes that she would like to get un control. Patient feels that she has not made her health a priority over the past several years while caring for her family. Spoke with patient about the importance make her health a  priority as well as self care and her mental health. Patient works from home and has often times had trouble with separating her work from her home life. Spoke with patient about trying deep breathing exercises, guided meditation, etc. To help reduce her stress levels and to help with overall mental health. We also spoke in detail about diet in relation to lowering blood sugar. Patient states that she does crave sweets and sugars. Spoke with patient about choosing healthier alternatives that will still offer sweetness to help with sugar cravings. Patient would like to join a gym as well so she can start doing regular exercise. Encouraged patient to call around to local gyms to see if any are offering new year discounts.    Goal: Patient would like to exercise more with a goal of loosing weight to help improve blood sugar. Patient will call to local gyms to see if any are offering new member discount rates. Patient will also work on doing some exercise from home by herself and with her children. Patient will work on reaching these goals in the coming weeks.    Navigation:  I will notify patient of lab results.  Patient is aware of 2 more health coaching sessions and a follow up. Will refer patient for follow-up with Internal Medicine for elevated BP once labs have resulted.  Time: 30 minutes

## 2021-07-12 LAB — LIPID PANEL
Chol/HDL Ratio: 3.1 ratio (ref 0.0–4.4)
Cholesterol, Total: 141 mg/dL (ref 100–199)
HDL: 46 mg/dL (ref >39)
LDL Chol Calc (NIH): 69 mg/dL (ref 0–99)
Triglycerides: 150 mg/dL — ABNORMAL HIGH (ref 0–149)
VLDL Cholesterol Cal: 26 mg/dL (ref 5–40)

## 2021-07-12 LAB — GLUCOSE, RANDOM: Glucose: 125 mg/dL — ABNORMAL HIGH (ref 70–99)

## 2021-07-12 LAB — HEMOGLOBIN A1C
Est. average glucose Bld gHb Est-mCnc: 160 mg/dL
Hgb A1c MFr Bld: 7.2 % — ABNORMAL HIGH (ref 4.8–5.6)

## 2021-07-16 ENCOUNTER — Telehealth: Payer: Self-pay

## 2021-07-16 NOTE — Telephone Encounter (Signed)
Health coaching 2    Labs- 141 cholesterol, 69 LDL cholesterol, 150 triglycerides, 46 HDL cholesterol, 7.2 hemoglobin A1C, 125 mean plasma glucose. Patient understands and is aware of her lab results.   Goals:  Continue watching the amount of sweets and sugars consumed. Look for sugar-free options for sweets to satisfy sugar cravings. Watch and cut back on the amount of carbs consumed. Continue trying to add in daily exercise for at least 20-30 minutes.   Navigation:  Patient is aware of 1 more health coaching sessions and a follow up. Patient has been referred to Internal Medicine for follow-up for elevated labs and BP. Will call patient with appointment information once scheduled.   Time-  10 minutes

## 2021-07-24 ENCOUNTER — Ambulatory Visit (INDEPENDENT_AMBULATORY_CARE_PROVIDER_SITE_OTHER): Payer: No Typology Code available for payment source | Admitting: Internal Medicine

## 2021-07-24 ENCOUNTER — Other Ambulatory Visit: Payer: Self-pay

## 2021-07-24 ENCOUNTER — Encounter: Payer: Self-pay | Admitting: Internal Medicine

## 2021-07-24 DIAGNOSIS — E119 Type 2 diabetes mellitus without complications: Secondary | ICD-10-CM

## 2021-07-24 DIAGNOSIS — I10 Essential (primary) hypertension: Secondary | ICD-10-CM

## 2021-07-24 MED ORDER — FREESTYLE LITE TEST VI STRP: ORAL_STRIP | 3 refills | Status: DC

## 2021-07-24 MED ORDER — OZEMPIC (0.25 OR 0.5 MG/DOSE) 2 MG/1.5ML ~~LOC~~ SOPN: PEN_INJECTOR | SUBCUTANEOUS | 0 refills | Status: DC

## 2021-07-24 MED ORDER — LANCETS 33G MISC: 3 refills | Status: AC

## 2021-07-24 NOTE — Patient Instructions (Addendum)
Thank you, [name] for allowing [place] to provide your care today. Today we discussed your diabetes and blood pressure.  Diabetes: We are starting you on Ozempic today given that you have tried metformin in the past but have not tolerated it.  I have prescribed you 3 months worth of this. Instructions:  Take 0.25 mg Ozempic once weekly for 4 weeks. After 4 weeks, increase this to 0.5 mg Ozempic once weekly. I have prescribed you three months worth of this. I have also refilled your test strips and lancets  Blood pressure: I would like to see you in a month just to make sure your blood pressure is ok because they were a little high in the clinic today! Keep having a healthy diet and exercising as you have been doing in the meantime.  I have ordered the following labs for you:  Lab Orders  No laboratory test(s) ordered today     Referrals ordered today:   Referral Orders  No referral(s) requested today     I have ordered the following medication/changed the following medications:   Stop the following medications: Medications Discontinued During This Encounter  Medication Reason   glucose blood (ACCU-CHEK GUIDE) test strip    Lancets Misc. (ACCU-CHEK FASTCLIX LANCET) KIT      Start the following medications: Meds ordered this encounter  Medications   Semaglutide,0.25 or 0.5MG /DOS, (OZEMPIC, 0.25 OR 0.5 MG/DOSE,) 2 MG/1.5ML SOPN    Sig: Inject 0.25 mg into the skin once a week for 28 days, THEN 0.5 mg once a week.    Dispense:  3.75 mL    Refill:  0   glucose blood (FREESTYLE LITE) test strip    Sig: Check blood sugar up to once daily    Dispense:  100 each    Refill:  3   Lancets 33G MISC    Sig: Check blood sugar up to once daily    Dispense:  100 each    Refill:  3     Follow up:  1 month   Should you have any questions or concerns please call the internal medicine clinic at [phone].

## 2021-07-24 NOTE — Assessment & Plan Note (Signed)
BP Readings from Last 3 Encounters:  07/24/21 (!) 164/102  07/11/21 (!) 130/92  02/11/20 140/77   Prior to this visit, patient's BP were in the 130s systolic at her gyn appt 2 weeks ago. Today, patient has elevated BP x2 in the 160s systolic. Pt has been implementing lifestyle changes to try to lower her blood pressure, such as a healthy diet and regular exercise.  Plan: Continue lifestyle changes for now. Will recheck BP at her next visit in 1 month.

## 2021-07-24 NOTE — Progress Notes (Signed)
° °  CC: wise woman follow-up/discussion of lab results  HPI:  Megan Barry is a 41 y.o. with past medical history as noted below who presents to the clinic today for discussion of lab results from her wise woman visit. Please see problem-based list for further details, assessments, and plans.  Past Medical History:  Diagnosis Date   Gestational diabetes    insulin   Review of Systems: Negative aside from that listed in individualized problem based charting.  Physical Exam:  Vitals:   07/24/21 1008  BP: (!) 164/102  Pulse: 89  Temp: (!) 97.5 F (36.4 C)  TempSrc: Oral  SpO2: 97%  Weight: 236 lb 14.4 oz (107.5 kg)  Height: 5\' 6"  (1.676 m)   General: NAD, nl appearance HE: Normocephalic, atraumatic, EOMI, Conjunctivae normal ENT: No congestion, no rhinorrhea, no exudate or erythema  Cardiovascular: Normal rate, regular rhythm. No murmurs, rubs, or gallops Pulmonary: Effort normal, breath sounds normal. No wheezes, rales, or rhonchi Abdominal: soft, nontender, bowel sounds present Musculoskeletal: no swelling, deformity, injury or tenderness in extremities Skin: Warm, dry, no bruising, erythema, or rash Psychiatric/Behavioral: normal mood, normal behavior     Assessment & Plan:   See Encounters Tab for problem based charting.  Patient discussed with Dr. 

## 2021-07-24 NOTE — Assessment & Plan Note (Addendum)
The patient has a history of T2DM (diagnosed in 2015). She has a freestyle lite meter that she got from a former provider [employer] [facility]). She has been referred here from her wise woman visit for an A1c of 7.2.   The patient has not been on any medications recently. She states that she has tried metformin in the past but has been unable to tolerate the medication due to its side effects. She is asking if there are any medications besides metformin that she might be able to better tolerate. Otherwise, she has been exercising regularly (3 times a week) and states that she maintains a healthy diet. Has tried to lose weight in the past but now feels like she is "hitting a wall."  Plan: Because the patient has been unable to tolerate metformin, will rx ozempic today. Will start with 0.25 mg once weekly for 4 weeks, then can increase to 0.5 mg once weekly. Refilled lancets and glucose test strips today as well. Follow-up in 1 month.

## 2021-07-30 ENCOUNTER — Telehealth: Payer: Self-pay

## 2021-07-30 NOTE — Telephone Encounter (Signed)
Requesting PA on Semaglutide,0.25 or 0.5MG /DOS, (OZEMPIC, 0.25 OR 0.5 MG/DOSE,) 2 MG/1.5ML SOPN.

## 2021-07-30 NOTE — Progress Notes (Signed)
Internal Medicine Clinic Attending ? ?I saw and evaluated the patient.  I personally confirmed the key portions of the history and exam documented by Dr. Bonanno and I reviewed pertinent patient test results.  The assessment, diagnosis, and plan were formulated together and I agree with the documentation in the resident?s note. ? ?

## 2021-08-02 ENCOUNTER — Telehealth: Payer: Self-pay

## 2021-08-02 NOTE — Progress Notes (Signed)
Called patient to discuss Ozempic medication.  She states that she was able to get in her paperwork for prior Auth to her insurance, however it would cost her $900 for the medication.  I will discuss this further with Dr. Mikey Bussing further in regards to our options at this point to make this medication more affordable for her.

## 2021-08-02 NOTE — Telephone Encounter (Signed)
Pa  for pt [facility] )  came though on cover my meds was done but the insurance  that was on files does not handle her medication PA..... Pt called her insurance and was able to get a paper form faxed over from ( [employer] ) was done and submitted with office notes .. awaiting approval or denial

## 2021-08-03 NOTE — Telephone Encounter (Signed)
DECISION:     APPROVED  start date 08/02/2021 end date 08/01/2022..      ( COPY SENT TO PHARMACY ALSO )

## 2021-08-03 NOTE — Telephone Encounter (Signed)
Pt called and even with the PA  the medication is $900 for one month supply  co pay .. please advise what to do .Marland Kitchen

## 2021-08-06 ENCOUNTER — Telehealth: Payer: Self-pay

## 2021-08-06 NOTE — Telephone Encounter (Signed)
Reached out to patient about email received about prescription for Ozempic. Patient has a high-deductible insurance policy ($10,000) where deductible has not been met. Patient was told by insurance company that rx would cost $900/mo. Encouraged patient to reach back out to Internal Medicine to see if another drug formulary would be covered at a lower price or to see if she would be eligible for the prescription drug assistance program. Patient voiced understanding. °

## 2021-08-09 ENCOUNTER — Other Ambulatory Visit: Payer: Self-pay | Admitting: Internal Medicine

## 2021-08-09 ENCOUNTER — Other Ambulatory Visit (HOSPITAL_COMMUNITY): Payer: Self-pay

## 2021-08-09 DIAGNOSIS — E119 Type 2 diabetes mellitus without complications: Secondary | ICD-10-CM

## 2021-08-09 MED ORDER — MOUNJARO 2.5 MG/0.5ML ~~LOC~~ SOAJ: 2.5000 mg | SUBCUTANEOUS | 0 refills | Status: DC

## 2021-08-09 NOTE — Assessment & Plan Note (Signed)
Addendum: Ozempic with prior Auth would cost the patient $900 for a 1 month supply.  Discussed with patient trying Mounjaro instead for her diabetes.  Discussed that this will need a prior Auth as well.  Patient is in agreement this plan.  We will contact patient once we receive more information from her insurance company. ?

## 2021-08-10 ENCOUNTER — Other Ambulatory Visit: Payer: Self-pay

## 2021-08-14 ENCOUNTER — Telehealth: Payer: Self-pay

## 2021-08-14 NOTE — Telephone Encounter (Signed)
Pa   for pt Megan Barry  came through on cover my meds was submitted with office notes to [employer]  @ [phone].Marland Kitchen ?Awaiting approval or denial  ?

## 2021-08-15 ENCOUNTER — Other Ambulatory Visit (HOSPITAL_COMMUNITY): Payer: Self-pay

## 2021-08-16 NOTE — Telephone Encounter (Signed)
Pt got the phone call from the pharmacy and they will no honor the coupon because the deduction is over $3000 so she is needing a call back ..she is wanting to know if she can get the paper copy of the [facility] RX to order it from [place] where she only has to pay no more than $200 ?

## 2021-08-16 NOTE — Telephone Encounter (Signed)
DECISION: ? ? ?APPROVED  from 08/15/2021 until 08/15/2022  ? ? Called pt to let her know ... but she stated that medication even though it was approved  is $900.. she has given them a coupon but she is waiting to find out how much it will be with the coupon .. ? ? ? ? ? ( COPY PLACED TO BE SCANNED TO CHART )  ?

## 2021-08-17 ENCOUNTER — Other Ambulatory Visit: Payer: Self-pay

## 2021-08-17 NOTE — Telephone Encounter (Signed)
Prescription for Megan Barry was accidentally deleted by the pharmacy per patient needs to be resent to Pharmacy. ?

## 2021-08-17 NOTE — Telephone Encounter (Signed)
tirzepatide [facility]) 2.5 MG/0.5ML [id number] [place], [place] - [address] AT [facility] [address] [place], [place] [address]  ?Phone:  [phone]  Fax:  [phone]  ? Please resend rx to pharamcy. ?

## 2021-08-20 ENCOUNTER — Telehealth: Payer: Self-pay

## 2021-08-20 NOTE — Telephone Encounter (Signed)
Forms came VIA fax  for pt to be able to get her OZEMPIC through [facility] pharmacy  for a cheaper cost .. was given to PCP to fill and return will fax back when it is completed and placed to be scanned to chart  ?

## 2021-08-20 NOTE — Telephone Encounter (Signed)
Forms completed and faxed back to [phone] ? ? ? ? ? ? ( [place] )  ?

## 2021-08-21 ENCOUNTER — Ambulatory Visit (INDEPENDENT_AMBULATORY_CARE_PROVIDER_SITE_OTHER): Payer: No Typology Code available for payment source | Admitting: Internal Medicine

## 2021-08-21 VITALS — BP 132/85 | HR 91 | Temp 98.3°F | Ht 66.0 in | Wt 232.8 lb

## 2021-08-21 DIAGNOSIS — E119 Type 2 diabetes mellitus without complications: Secondary | ICD-10-CM | POA: Diagnosis not present

## 2021-08-21 NOTE — Assessment & Plan Note (Addendum)
The patient is here today for diabetes follow-up.  The patient and I have been in touch multiple times since her last visit in regards to affordability of Ozempic that was previously prescribed at my last visit with her. Ozempic was found to be over $900 a month with patient's insurance, so we explored other options such as Mounjaro for the patient. Also attempted different pharmacies and most recently prescribed ozempic through [place] to see if this could be an option for the patient. However, pt states that she found a coupon for ozempic making it somewhat cheaper for her (~$700 for about 1-1.5 months supply), and the patient purchased ozempic and started her first dose last Sunday.  ? ?Pt states she is doing well overall. She endorses having some headaches and "feeling hot" the day after her first dose, however she states that she feels much better today. She states that she would like to try ozempic for now while she explores other options to make this more affordable for her. States that she has also looked in [place] and found an option that would cost about $200 a month. ? ?Plan: ?Continue ozempic 0.25 mg weekly. Pt instructed to take this for 28 days. She will increase this to 0.5 mg weekly after 28 days. I will see her in 1 month for diabetes follow-up. In the meantime, I will continue to explore other GLP-1 agonists that might be more affordable for her. ?Referred to ophthalmology for eye exam.  ? ? ? ? ? ?

## 2021-08-21 NOTE — Progress Notes (Signed)
? ?  CC: diabetes follow-up ? ?HPI: ? ?[name] is a 41 y.o. with past medical history as noted below who presents to the clinic today for a diabetes follow-up. Please see problem-based list for further details, assessments, and plans.  ? ?Past Medical History:  ?Diagnosis Date  ? Gestational diabetes   ? insulin  ? ?Review of Systems: Negative aside from that listed in individualized problem based charting.  ? ?Physical Exam: ? ?Vitals:  ? 08/21/21 1039  ?BP: 132/85  ?Pulse: 91  ?Temp: 98.3 ?F (36.8 ?C)  ?TempSrc: Oral  ?SpO2: 100%  ?Weight: 232 lb 12.8 oz (105.6 kg)  ?Height: 5\' 6"  (1.676 m)  ? ?General: NAD, nl appearance ?HE: Normocephalic, atraumatic, EOMI, Conjunctivae normal ?ENT: No congestion, no rhinorrhea, no exudate or erythema  ?Cardiovascular: Normal rate, regular rhythm. No murmurs, rubs, or gallops ?Pulmonary: Effort normal, breath sounds normal. No wheezes, rales, or rhonchi ?Abdominal: soft, nontender, bowel sounds present ?Musculoskeletal: no swelling, deformity, injury or tenderness in extremities ?Skin: Warm, dry, no bruising, erythema, or rash ?Psychiatric/Behavioral: normal mood, normal behavior    ? ? ?Assessment & Plan:  ? ?See Encounters Tab for problem based charting. ? ?Patient discussed with Dr.  Cain Sieve ? ?

## 2021-08-21 NOTE — Patient Instructions (Addendum)
Thank you, Megan Barry for allowing [place] to provide your care today. Today we discussed your diabetes. ? ?1) I will reach out to your pharmacy to see what the cheapest option is for you. Continue ozempic 0.25 mg for now. ? ?2) I have placed a referral for ophthalmology for a diabetic eye exam.  ? ?I have ordered the following labs for you: ? ?Lab Orders    ?     Microalbumin / Creatinine Urine Ratio    ?  ? ?Referrals ordered today:  ? ?Referral Orders    ?     Ambulatory referral to Ophthalmology    ?  ? ?I have ordered the following medication/changed the following medications:  ? ?Stop the following medications: ?There are no discontinued medications.  ? ?Start the following medications: ?No orders of the defined types were placed in this encounter. ?  ? ?Follow up:  1 month   ? ? ?Should you have any questions or concerns please call the internal medicine clinic at [phone].   ? ? ? ?

## 2021-08-22 LAB — MICROALBUMIN / CREATININE URINE RATIO
Creatinine, Urine: 62.5 mg/dL
Microalb/Creat Ratio: 6 mg/g creat (ref 0–29)
Microalbumin, Urine: 3.9 ug/mL

## 2021-08-22 NOTE — Progress Notes (Signed)
Internal Medicine Clinic Attending ° °Case discussed with Dr. Bonanno  °  At the time of the visit.  We reviewed the resident’s history and exam and pertinent patient test results.  I agree with the assessment, diagnosis, and plan of care documented in the resident’s note. ° °

## 2021-09-07 ENCOUNTER — Telehealth: Payer: Self-pay | Admitting: Internal Medicine

## 2021-09-07 ENCOUNTER — Other Ambulatory Visit: Payer: Self-pay | Admitting: Internal Medicine

## 2021-09-07 MED ORDER — OZEMPIC (0.25 OR 0.5 MG/DOSE) 2 MG/3ML ~~LOC~~ SOPN: 0.2500 mg | PEN_INJECTOR | SUBCUTANEOUS | 2 refills | Status: DC

## 2021-09-07 NOTE — Telephone Encounter (Signed)
Called the patient to discuss Ozempic refill request.  The patient states that she would like to get this filled in [place] where it would cost her $200.  She is requesting a physical prescription so that she can fill this there.  I printed and signed prescription to get this filled in [place].  This will be mailed to her per patient request. ?

## 2021-09-11 ENCOUNTER — Other Ambulatory Visit: Payer: Self-pay | Admitting: Internal Medicine

## 2021-09-26 ENCOUNTER — Telehealth: Payer: Self-pay | Admitting: Internal Medicine

## 2021-09-27 MED ORDER — OZEMPIC (0.25 OR 0.5 MG/DOSE) 2 MG/1.5ML ~~LOC~~ SOPN: 0.5000 mg | PEN_INJECTOR | SUBCUTANEOUS | 0 refills | Status: DC

## 2021-09-27 NOTE — Telephone Encounter (Signed)
Called pt to discuss ozempic medication. Pt states that [place] will no longer cover Ozempic for lower price, and pt notes that she has contacted  her insurance company who advised her to send semaglutide to a [employer], newspaper. Sent Ozempic refill to [facility] in [place].  ?

## 2021-09-28 ENCOUNTER — Telehealth: Payer: Self-pay | Admitting: *Deleted

## 2021-09-28 NOTE — Telephone Encounter (Signed)
Call from [facility] will be unable to fill prescription for [facility] for patient.  Prescription will need to e sent to another Pharmacy.  Call to patient ot see which pharmacy she would like to have the prescription sent to.    ?

## 2021-10-01 MED ORDER — OZEMPIC (0.25 OR 0.5 MG/DOSE) 2 MG/1.5ML ~~LOC~~ SOPN: 0.5000 mg | PEN_INJECTOR | SUBCUTANEOUS | 0 refills | Status: AC

## 2021-10-11 ENCOUNTER — Encounter: Payer: No Typology Code available for payment source | Admitting: Internal Medicine

## 2021-10-17 ENCOUNTER — Other Ambulatory Visit (HOSPITAL_COMMUNITY): Payer: Self-pay

## 2021-12-01 ENCOUNTER — Encounter: Payer: Self-pay | Admitting: *Deleted

## 2022-02-22 IMAGING — CT CT ABD-PELV W/O CM
2 of 4 series · 13 of 46 positions shown, 15 images · non-contrast
Comparison: None.

CLINICAL DATA: Abnormal KUB. Evaluate for kidney stones. Left flank
pain 2 weeks.

EXAM:
CT ABDOMEN AND PELVIS WITHOUT CONTRAST
TECHNIQUE: Multidetector CT imaging of the abdomen and pelvis was performed
following the standard protocol without IV contrast.

[Series 2: renal stone 5.00 br40 s3 axial · axial · 0.67mm/px · z∈[+1286,+1701]mm · 10 of 101 slices shown, 12 images]
[im 9/101  soft-tissue]
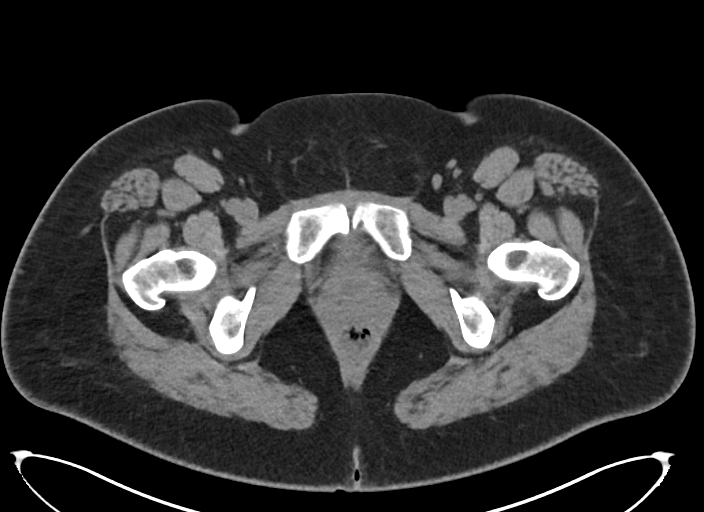
[im 9/101  bone]
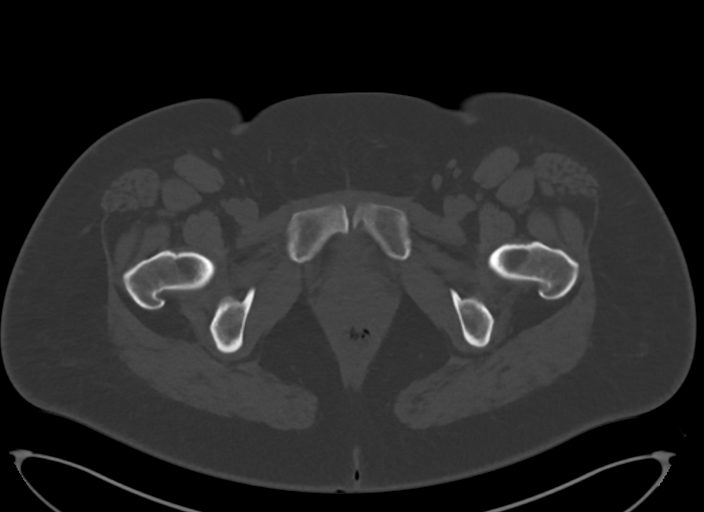
[im 18/101  soft-tissue]
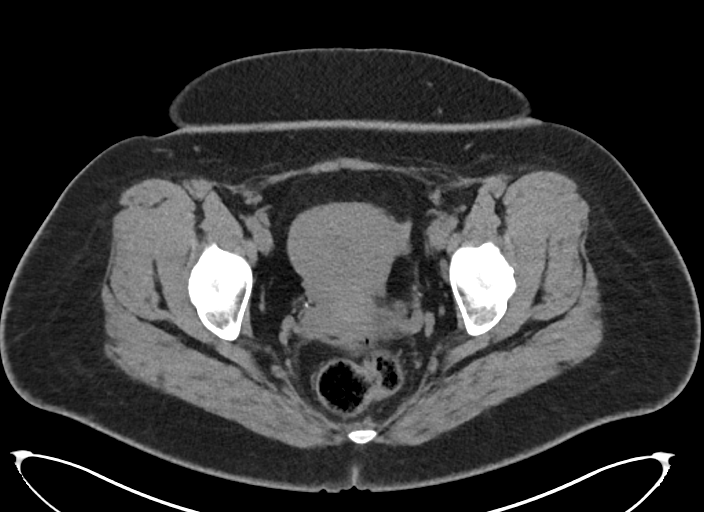
[im 27/101  soft-tissue]
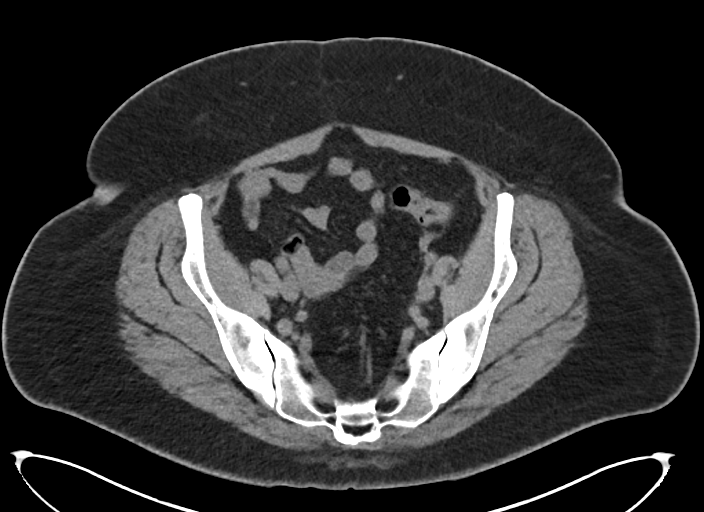
[im 35/101  soft-tissue]
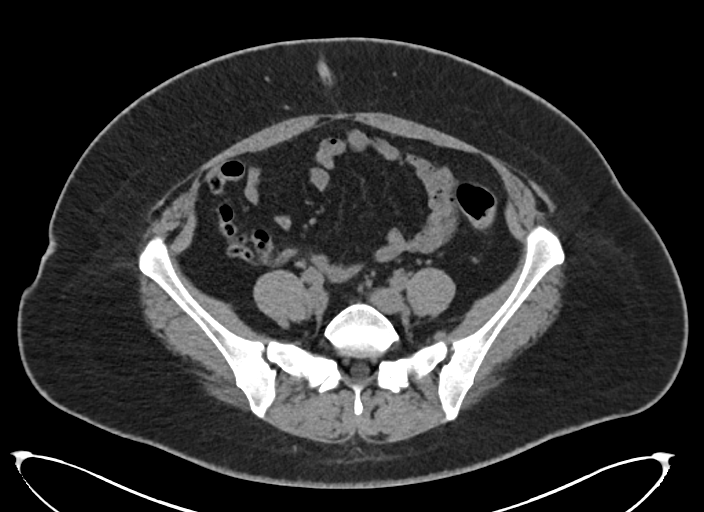
[im 44/101  soft-tissue]
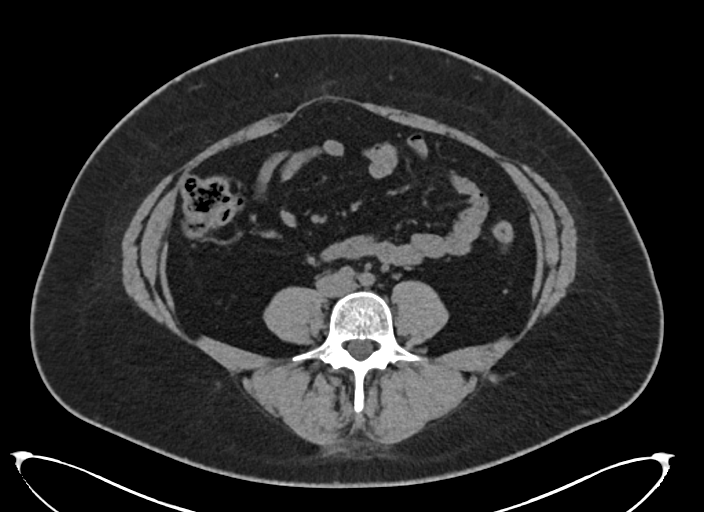
[im 57/101  soft-tissue]
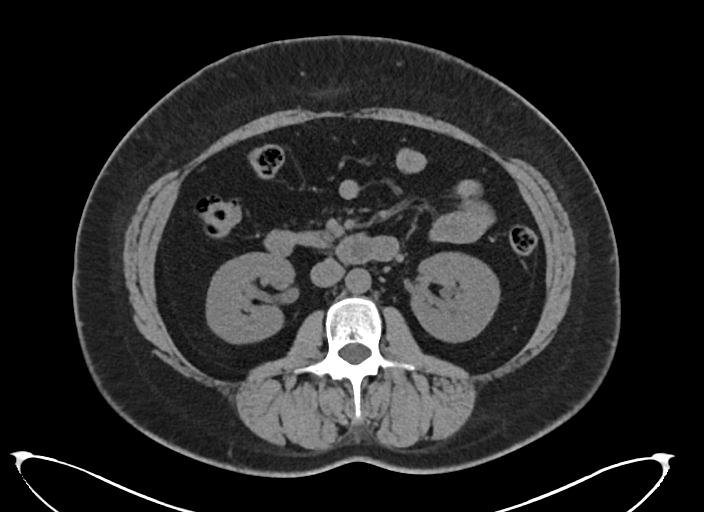
[im 66/101  soft-tissue]
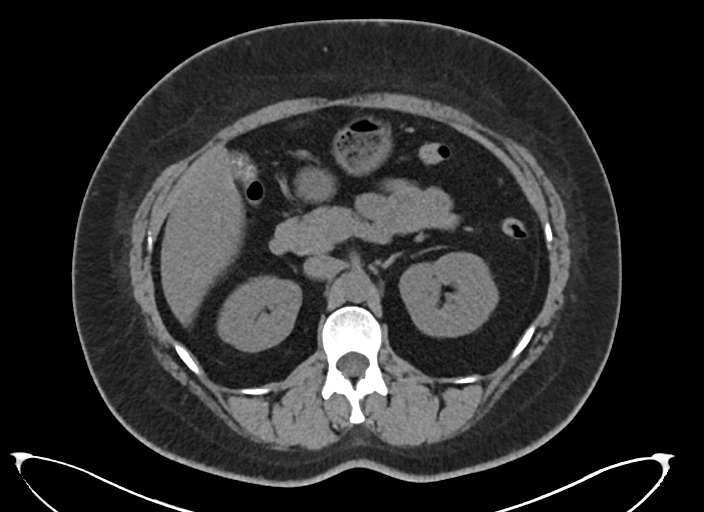
[im 74/101  soft-tissue]
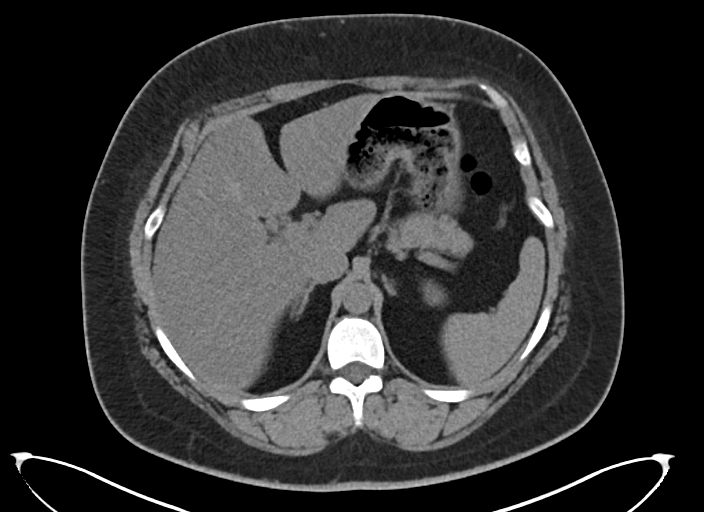
[im 83/101  soft-tissue]
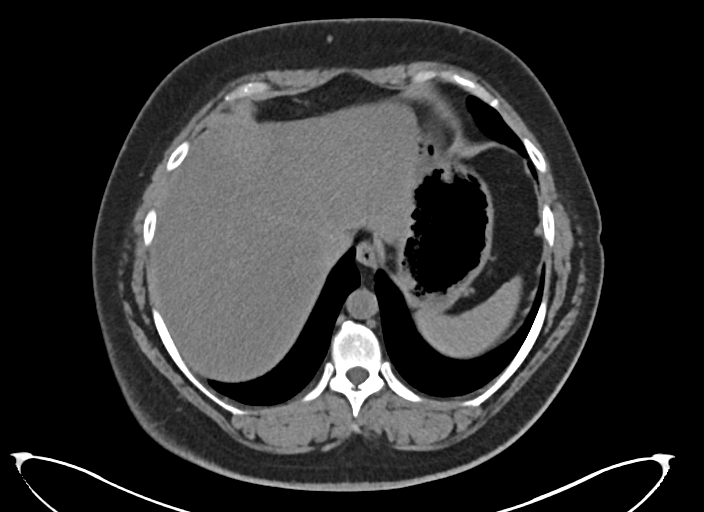
[im 83/101  bone]
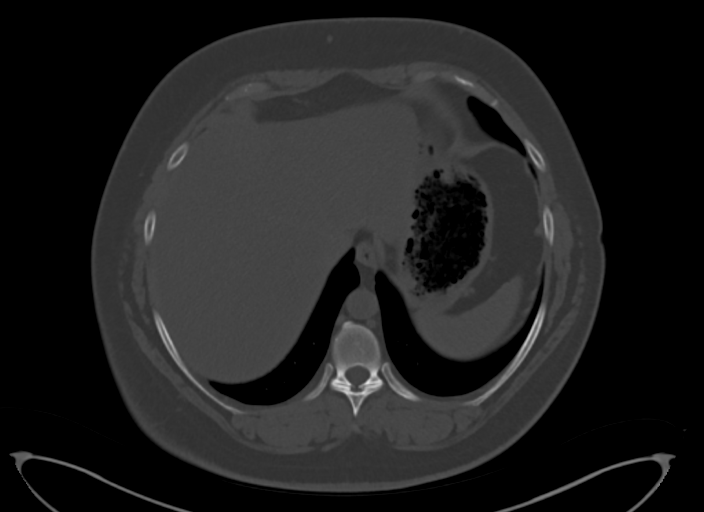
[im 92/101  soft-tissue]
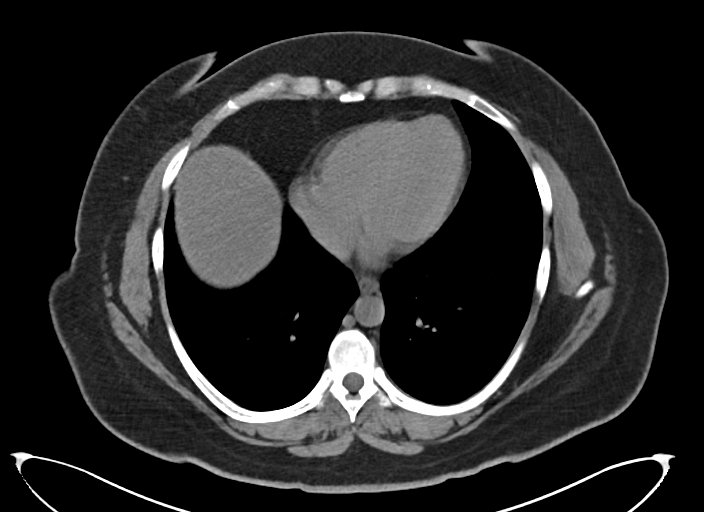

[Series 6: renal stone 2.00 br40 s3 cor · coronal · 0.91mm/px · 3 of 170 slices shown]
[im 57/170  soft-tissue]
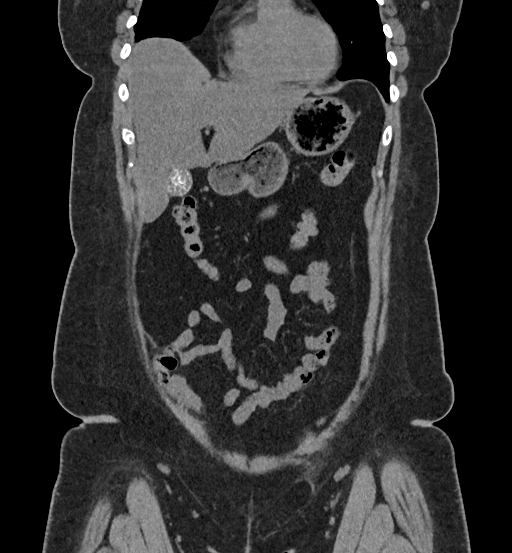
[im 76/170  soft-tissue]
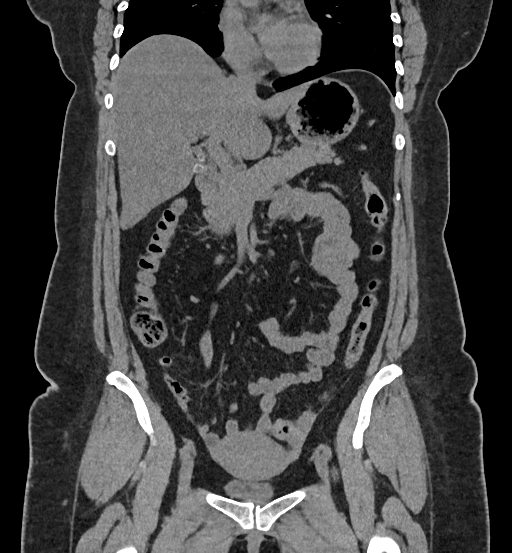
[im 94/170  soft-tissue]
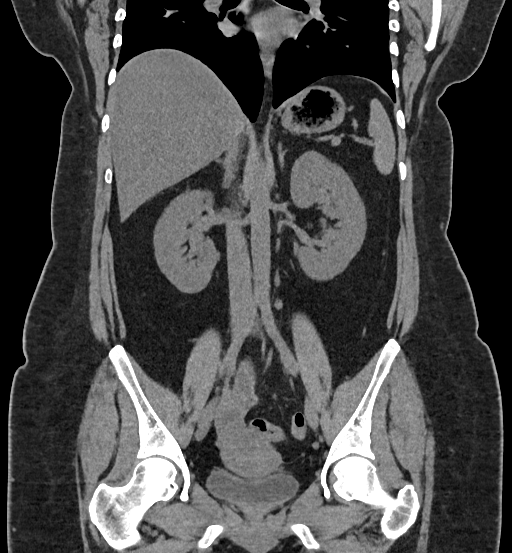

[13 of 46 positions shown; findings below may reference images not displayed]

FINDINGS: Lower chest: Lung bases are clear.

Hepatobiliary: Gallbladder is full of rim calcified stones. Liver
and biliary tree are normal.

Pancreas: Normal.

Spleen: Normal.

Adrenals/Urinary Tract: Adrenal glands are normal. Kidneys are
normal size. Subcentimeter exophytic hypodensity over the mid pole
left kidney too small to characterize but likely a cyst. No
perinephric inflammation or fluid. Possible punctate stone over the
mid pole right kidney. Two stones in the lower pole of the left
kidney measuring approximately 4 mm. Ureters and bladder are normal.

Stomach/Bowel: Stomach and small bowel are normal. Appendix is
normal. Colon is normal.

Vascular/Lymphatic: Abdominal aorta is normal caliber. There is no
evidence of adenopathy.

Reproductive: Normal.

Other: No free fluid or focal inflammatory change.

Musculoskeletal: No acute findings.
IMPRESSION: 1. Bilateral nephrolithiasis left worse than right. No evidence of
ureteral stone or obstruction.

2. Subcentimeter exophytic hypodensity over the mid pole left kidney
too small to characterize but likely a cyst.

3.  Moderate cholelithiasis.

## 2022-03-05 ENCOUNTER — Telehealth: Payer: Self-pay

## 2022-03-05 NOTE — Telephone Encounter (Signed)
Health Coaching 3  Goals- Patient has been working on diet and exercise to control her blood sugar. With the patient's current insurance she unfortunately was unable to find a blood sugar lowering medication that was covered by her insurance. Patient has been trying to watch the amount of sugar that she consumes as well as carbs. Patient has been walking for exercise.   New goal-   Barrier to reaching goal-    Strategies to overcome-    Navigation:  Patient is aware of  a follow up session on 04/03/22 @ 3:30 pm   Time-  10 min

## 2022-04-01 ENCOUNTER — Telehealth: Payer: No Typology Code available for payment source | Admitting: Physician Assistant

## 2022-04-01 DIAGNOSIS — R3989 Other symptoms and signs involving the genitourinary system: Secondary | ICD-10-CM

## 2022-04-01 MED ORDER — CEPHALEXIN 500 MG PO CAPS: 500.0000 mg | ORAL_CAPSULE | Freq: Two times a day (BID) | ORAL | 0 refills | Status: AC

## 2022-04-01 NOTE — Progress Notes (Signed)
I have spent 5 minutes in review of e-visit questionnaire, review and updating patient chart, medical decision making and response to patient.   Falecia Vannatter Cody Lashena Signer, PA-C    

## 2022-04-01 NOTE — Progress Notes (Signed)

## 2022-04-03 ENCOUNTER — Inpatient Hospital Stay: Payer: No Typology Code available for payment source | Attending: Obstetrics and Gynecology | Admitting: *Deleted

## 2022-04-03 ENCOUNTER — Other Ambulatory Visit: Payer: Self-pay | Admitting: Internal Medicine

## 2022-04-03 ENCOUNTER — Other Ambulatory Visit: Payer: Self-pay

## 2022-04-03 ENCOUNTER — Other Ambulatory Visit: Payer: Self-pay | Admitting: Student

## 2022-04-03 VITALS — BP 130/84 | Ht 66.0 in | Wt 221.0 lb

## 2022-04-03 DIAGNOSIS — Z1231 Encounter for screening mammogram for malignant neoplasm of breast: Secondary | ICD-10-CM

## 2022-04-03 DIAGNOSIS — Z0000 Encounter for general adult medical examination without abnormal findings: Secondary | ICD-10-CM

## 2022-04-03 NOTE — Progress Notes (Signed)
Wisewoman follow up    Clinical Measurement:   Vitals:   04/03/22 1532 04/03/22 1554  BP: 130/82 130/84      Medical History:  Patient states that she  does not know if she has  high cholesterol, does not have high blood pressure and she has diabetes.  Medications:  Patient states that she does not take medication to lower cholesterol, blood pressure and blood sugar.  Patient does not take an aspirin a day to help prevent a heart attack or stroke.    Blood pressure, self measurement:  :  Patient states that she  does not have the equipment to  measure blood pressure from home. She checks her blood pressure N/A. She shares her readings with a health care provider: N/A.   Nutrition: Patient states that on average she eats 2 cups of fruit and 1 cups of vegetables per day. Patient states that she does not eat fish at least 2 times per week. Patient eats about half servings of whole grains. Patient drinks less than 36 ounces of beverages with added sugar weekly: yes. Patient is currently watching sodium or salt intake: yes. In the past 7 days patient has had 0 drinks containing alcohol. On average patient drinks 0 drinks containing alcohol per day.      Physical activity:  Patient states that she gets 60 minutes of moderate and 60 minutes of vigorous physical activity each week.  Smoking status:  Patient states that she has has never smoked .   Quality of life:  Over the past 2 weeks patient states that she had little interest or pleasure in doing things: not at all. She has been feeling down, depressed or hopeless:not at all.    Risk reduction and counseling:   Health Coaching: Patient has been working on lowering blood sugar. Patient has been watching her diet and trying to practice a low carb diet. Patient has been exercising 2-3 days during the work week and tries to do more on the weekends. Patient has lost almost 10 pounds since her initial screening. Patient states that she is feeling  better and has more energy since making these lifestyle changes. Patient wants to continue with her weight loss journey to help with lowering blood sugar.    Navigation: This was the  follow up session for this patient, I will check up on her progress in the coming months.  Time: 20 minutes

## 2022-04-11 ENCOUNTER — Ambulatory Visit
Admission: RE | Admit: 2022-04-11 | Discharge: 2022-04-11 | Disposition: A | Discharge status: Home / Self Care | Payer: No Typology Code available for payment source | Source: Ambulatory Visit | Attending: Internal Medicine | Admitting: Internal Medicine

## 2022-04-11 DIAGNOSIS — Z1231 Encounter for screening mammogram for malignant neoplasm of breast: Secondary | ICD-10-CM

## 2022-06-12 ENCOUNTER — Ambulatory Visit: Payer: No Typology Code available for payment source

## 2022-06-12 ENCOUNTER — Other Ambulatory Visit: Payer: No Typology Code available for payment source

## 2022-07-01 ENCOUNTER — Encounter: Payer: Self-pay | Admitting: Student

## 2022-07-25 ENCOUNTER — Ambulatory Visit (INDEPENDENT_AMBULATORY_CARE_PROVIDER_SITE_OTHER): Payer: No Typology Code available for payment source

## 2022-07-25 ENCOUNTER — Other Ambulatory Visit: Payer: Self-pay

## 2022-07-25 VITALS — BP 136/95 | HR 88 | Temp 97.5°F | Ht 66.0 in | Wt 221.4 lb

## 2022-07-25 DIAGNOSIS — Z23 Encounter for immunization: Secondary | ICD-10-CM

## 2022-07-25 DIAGNOSIS — I10 Essential (primary) hypertension: Secondary | ICD-10-CM | POA: Diagnosis not present

## 2022-07-25 DIAGNOSIS — E119 Type 2 diabetes mellitus without complications: Secondary | ICD-10-CM | POA: Diagnosis not present

## 2022-07-25 DIAGNOSIS — Z1159 Encounter for screening for other viral diseases: Secondary | ICD-10-CM

## 2022-07-25 DIAGNOSIS — Z0000 Encounter for general adult medical examination without abnormal findings: Secondary | ICD-10-CM | POA: Insufficient documentation

## 2022-07-25 LAB — POCT GLYCOSYLATED HEMOGLOBIN (HGB A1C): Hemoglobin A1C: 7 % — AB (ref 4.0–5.6)

## 2022-07-25 LAB — GLUCOSE, CAPILLARY: Glucose-Capillary: 111 mg/dL — ABNORMAL HIGH (ref 70–99)

## 2022-07-25 MED ORDER — SEMAGLUTIDE(0.25 OR 0.5MG/DOS) 2 MG/3ML ~~LOC~~ SOPN: 0.2500 mg | PEN_INJECTOR | SUBCUTANEOUS | 0 refills | Status: DC

## 2022-07-25 NOTE — Assessment & Plan Note (Addendum)
A1c from 7.2 to 7.0 today. Patient has failed metformin in the past as she was unable to tolerate the GI side effects with persistent N/V despite >1 month on the medication. Has had trouble with insurance coverage of ozempic in the past but would be a good option for her for cardiovascular protection and given comorbid obesity. Will get prior auth submitted. Checking annual eGFR today. Foot exam completed and normal. LDL most recently 69 07/2021. Should be on moderate intensity statin given T2DM. -foot exam -f/u BMP -ozempic prior auth -optho referral placed for retinal eye exam -discuss starting moderate intensity statin

## 2022-07-25 NOTE — Patient Instructions (Addendum)
Thank you, Ms.Simon Rhein for allowing [place] to provide your care today. Today we discussed :  Diabetes: Your A1c is at goal of 7%. We will work on a prior authorization for ozempic and if unable to get this will try mounjaro. We did your foot exam today. We also checked your blood work for kidney function and electrolytes. We will see you in 3 months.  High blood pressure: Please get a automatic blood pressure cuff and check you morning and evening blood pressure. Record this on paper and bring it in at your follow up visit.  Vaccines: We did your tdap and flu today. We are checking for immunity to Hepatitis B which would suffice for the shot. Please call the [place] [facility] for the remainder of the vaccines: [phone]  I have ordered the following labs for you:   Lab Orders         Glucose, capillary         BMP8+Anion Gap         Hepatitis C Ab reflex to Quant PCR         Hepatitis B Surface Antibody         POC Hbg A1C       I have ordered the following medication/changed the following medications:   Stop the following medications: There are no discontinued medications.   Start the following medications: Meds ordered this encounter  Medications   Semaglutide,0.25 or 0.5MG/DOS, 2 MG/3ML SOPN    Sig: Inject 0.25 mg into the skin once a week.    Dispense:  3 mL    Refill:  0     Follow up: 3 months     We look forward to seeing you next time. Please call our clinic at [phone] if you have any questions or concerns. The best time to call is Monday-Friday from 9am-4pm, but there is someone available 24/7. If after hours or the weekend, call the main hospital number and ask for the Internal Medicine Resident On-Call. If you need medication refills, please notify your pharmacy one week in advance and they will send [place] a request.   Thank you for trusting me with your care. Wishing you the best!   Iona Coach, MD [place]

## 2022-07-25 NOTE — Assessment & Plan Note (Signed)
Noted to have elevated blood pressure in the past to the 123456 systolic, now [id number] in clinic. Will hold off on medications. Instructed patient to get home BP cuff and record AM and PM BP every few days. Patient to bring log to next visit, if BP elevated at home can treat.

## 2022-07-25 NOTE — Assessment & Plan Note (Signed)
Received Tdap and flu vaccines today. Records from [place] with COVID vaccine x 3. Checking HBSab for immunity today. Gave information for [place] for remainder of vaccines needed to apply for green card as patient is originally from [place] and has not been able to obtain vaccination records from there. Reports recent pap smear, requesting [facility] records. HCV screening done today.

## 2022-07-25 NOTE — Progress Notes (Deleted)
Prior to this visit, patient's BP were in the [id number] systolic at her gyn appt 2 weeks ago. Today, patient has elevated BP x2 in the 123456 systolic. Pt has been implementing lifestyle changes to try to lower her blood pressure, such as a healthy diet and regular exercise.   Plan: Continue lifestyle changes for now. Will recheck BP at her next visit in 1 month.  T2DM A1c 7.2 to 7.0 The patient is here today for diabetes follow-up.  The patient and I have been in touch multiple times since her last visit in regards to affordability of Ozempic that was previously prescribed at my last visit with her. Ozempic was found to be over $900 a month with patient's insurance, so we explored other options such as Mounjaro for the patient. Also attempted different pharmacies and most recently prescribed ozempic through [place] to see if this could be an option for the patient. However, pt states that she found a coupon for ozempic making it somewhat cheaper for her (~$700 for about 1-1.5 months supply), and the patient purchased ozempic and started her first dose last Sunday.    Pt states she is doing well overall. She endorses having some headaches and "feeling hot" the day after her first dose, however she states that she feels much better today. She states that she would like to try ozempic for now while she explores other options to make this more affordable for her. States that she has also looked in [place] and found an option that would cost about $200 a month.   Plan: Continue ozempic 0.25 mg weekly. Pt instructed to take this for 28 days. She will increase this to 0.5 mg weekly after 28 days. I will see her in 1 month for diabetes follow-up. In the meantime, I will continue to explore other GLP-1 agonists that might be more affordable for her. Referred to ophthalmology for eye exam  LDL 69 07/2021  HCM: Foot exam Pap smear HCV eGFR  Wants flu shot** [facility] paperwork

## 2022-07-26 LAB — BMP8+ANION GAP
Anion Gap: 17 mmol/L (ref 10.0–18.0)
BUN/Creatinine Ratio: 10 (ref 9–23)
BUN: 7 mg/dL (ref 6–24)
CO2: 21 mmol/L (ref 20–29)
Calcium: 9 mg/dL (ref 8.7–10.2)
Chloride: 101 mmol/L (ref 96–106)
Creatinine, Ser: 0.68 mg/dL (ref 0.57–1.00)
Glucose: 97 mg/dL (ref 70–99)
Potassium: 4.4 mmol/L (ref 3.5–5.2)
Sodium: 139 mmol/L (ref 134–144)
eGFR: 112 mL/min/{1.73_m2} (ref >59)

## 2022-07-26 LAB — HCV INTERPRETATION

## 2022-07-26 LAB — HCV AB W REFLEX TO QUANT PCR: HCV Ab: NONREACTIVE

## 2022-07-26 LAB — HEPATITIS B SURFACE ANTIBODY,QUALITATIVE: Hep B Surface Ab, Qual: NONREACTIVE

## 2022-07-26 NOTE — Progress Notes (Signed)
Established Patient Office Visit  Subjective   Patient ID: Megan Barry, female    DOB: 10-11-1980  Age: 42 y.o. MRN: [id number]  Chief Complaint  Patient presents with   Follow-up    ROUTINE OFFICE VISIT / DM / HAS QUESTION ABOUT MEDICATION- AND CHILD HOOD SHOTS    Megan Barry is a 42 y/o female with a pmh outlined below. Please see A&P for HPI.      Review of Systems  All other systems reviewed and are negative.     Objective:     BP (!) 136/95 (BP Location: Right Arm, Patient Position: Sitting, Cuff Size: Normal)   Pulse 88   Temp (!) 97.5 F (36.4 C) (Oral)   Ht 5' 6"$  (1.676 m)   Wt 221 lb 6.4 oz (100.4 kg)   SpO2 99%   BMI 35.73 kg/m    Physical Exam Constitutional:      General: She is not in acute distress.    Appearance: Normal appearance. She is obese.  Eyes:     General: No scleral icterus.    Extraocular Movements: Extraocular movements intact.     Conjunctiva/sclera: Conjunctivae normal.     Pupils: Pupils are equal, round, and reactive to light.  Cardiovascular:     Rate and Rhythm: Normal rate.     Pulses: Normal pulses.     Heart sounds: Normal heart sounds. No murmur heard.    No gallop.  Pulmonary:     Effort: Pulmonary effort is normal. No respiratory distress.     Breath sounds: Normal breath sounds. No wheezing or rales.  Musculoskeletal:     Right lower leg: No edema.     Left lower leg: No edema.  Skin:    General: Skin is warm and dry.     Capillary Refill: Capillary refill takes less than 2 seconds.  Neurological:     General: No focal deficit present.     Mental Status: She is alert.  Psychiatric:        Mood and Affect: Mood normal.        Behavior: Behavior normal.      Results for orders placed or performed in visit on 07/25/22  Glucose, capillary  Result Value Ref Range   Glucose-Capillary 111 (H) 70 - 99 mg/dL  BMP8+Anion Gap  Result Value Ref Range   Glucose WILL FOLLOW    BUN WILL FOLLOW    Creatinine, Ser  WILL FOLLOW    eGFR WILL FOLLOW    BUN/Creatinine Ratio WILL FOLLOW    Sodium WILL FOLLOW    Potassium WILL FOLLOW    Chloride WILL FOLLOW    CO2 WILL FOLLOW    Anion Gap WILL FOLLOW    Calcium WILL FOLLOW   Hepatitis C Ab reflex to Quant PCR  Result Value Ref Range   HCV Ab Non Reactive Non Reactive  Hepatitis B Surface Antibody  Result Value Ref Range   Hep B Surface Ab, Qual Non Reactive   Interpretation:  Result Value Ref Range   HCV Interp 1: Comment   POC Hbg A1C  Result Value Ref Range   Hemoglobin A1C 7.0 (A) 4.0 - 5.6 %   HbA1c POC (<> result, manual entry)     HbA1c, POC (prediabetic range)     HbA1c, POC (controlled diabetic range)        The 10-year ASCVD risk score (Arnett DK, et al., 2019) is: 0.9%    Assessment & Plan:   Problem  List Items Addressed This Visit       Cardiovascular and Mediastinum   Hypertension    Noted to have elevated blood pressure in the past to the [id number] systolic, now [id number] in clinic. Will hold off on medications. Instructed patient to get home BP cuff and record AM and PM BP every few days. Patient to bring log to next visit, if BP elevated at home can treat.        Endocrine   Type II diabetes mellitus ([facility]) - Primary    A1c from 7.2 to 7.0 today. Patient has failed metformin in the past as she was unable to tolerate the GI side effects with persistent N/V despite >1 month on the medication. Has had trouble with insurance coverage of ozempic in the past but would be a good option for her for cardiovascular protection and given comorbid obesity. Will get prior auth submitted. Checking annual eGFR today. Foot exam completed and normal. LDL most recently 69 07/2021. Should be on moderate intensity statin given T2DM. -foot exam -f/u BMP -ozempic prior auth -optho referral placed for retinal eye exam -discuss starting moderate intensity statin      Relevant Medications   Semaglutide,0.25 or 0.5MG/DOS, 2 MG/3ML SOPN   Other  Relevant Orders   POC Hbg A1C (Completed)   BMP8+Anion Gap (Completed)   Ambulatory referral to Ophthalmology     Other   Health care maintenance    Received Tdap and flu vaccines today. Records from [place] with COVID vaccine x 3. Checking HBSab for immunity today. Gave information for [place] for remainder of vaccines needed to apply for green card as patient is originally from [place] and has not been able to obtain vaccination records from there. Reports recent pap smear, requesting [facility]. HCV screening done today.      Other Visit Diagnoses     Encounter for hepatitis C screening test for low risk patient       Relevant Orders   Hepatitis C Ab reflex to Quant PCR (Completed)   Need for diphtheria-tetanus-pertussis (Tdap) vaccine       Relevant Orders   Tdap vaccine greater than or equal to 7yo IM (Completed)   Need for DTaP, Hib, and HBV vaccine       Relevant Orders   Hepatitis B Surface Antibody (Completed)       No follow-ups on file.    Iona Coach, MD

## 2022-07-29 ENCOUNTER — Other Ambulatory Visit: Payer: Self-pay

## 2022-07-29 DIAGNOSIS — E119 Type 2 diabetes mellitus without complications: Secondary | ICD-10-CM

## 2022-07-29 NOTE — Progress Notes (Signed)
Attempted to call patient, mailbox full. Will leave my chart message. BMP wnl, no changes to be made in management.

## 2022-08-06 NOTE — Progress Notes (Signed)
Internal Medicine Clinic Attending  Case discussed with Dr. Stann Mainland  at the time of the visit.  We reviewed the resident's history and exam and pertinent patient test results.  I agree with the assessment, diagnosis, and plan of care documented in the resident's note.

## 2022-08-10 ENCOUNTER — Telehealth: Payer: No Typology Code available for payment source | Admitting: Family Medicine

## 2022-08-10 DIAGNOSIS — J111 Influenza due to unidentified influenza virus with other respiratory manifestations: Secondary | ICD-10-CM

## 2022-08-10 MED ORDER — OSELTAMIVIR PHOSPHATE 75 MG PO CAPS: 75.0000 mg | ORAL_CAPSULE | Freq: Two times a day (BID) | ORAL | 0 refills | Status: AC

## 2022-08-10 NOTE — Progress Notes (Signed)

## 2022-10-23 ENCOUNTER — Other Ambulatory Visit: Payer: Self-pay

## 2022-10-23 ENCOUNTER — Ambulatory Visit (INDEPENDENT_AMBULATORY_CARE_PROVIDER_SITE_OTHER): Payer: No Typology Code available for payment source | Admitting: Student

## 2022-10-23 ENCOUNTER — Telehealth: Payer: Self-pay | Admitting: *Deleted

## 2022-10-23 ENCOUNTER — Encounter: Payer: Self-pay | Admitting: Student

## 2022-10-23 ENCOUNTER — Other Ambulatory Visit (HOSPITAL_COMMUNITY): Payer: Self-pay

## 2022-10-23 VITALS — BP 119/69 | HR 94 | Temp 97.6°F | Ht 66.0 in | Wt 222.1 lb

## 2022-10-23 DIAGNOSIS — R7612 Nonspecific reaction to cell mediated immunity measurement of gamma interferon antigen response without active tuberculosis: Secondary | ICD-10-CM

## 2022-10-23 DIAGNOSIS — Z7985 Long-term (current) use of injectable non-insulin antidiabetic drugs: Secondary | ICD-10-CM

## 2022-10-23 DIAGNOSIS — F411 Generalized anxiety disorder: Secondary | ICD-10-CM | POA: Diagnosis not present

## 2022-10-23 DIAGNOSIS — R5383 Other fatigue: Secondary | ICD-10-CM

## 2022-10-23 DIAGNOSIS — E119 Type 2 diabetes mellitus without complications: Secondary | ICD-10-CM

## 2022-10-23 MED ORDER — MOUNJARO 2.5 MG/0.5ML ~~LOC~~ SOAJ: 2.5000 mg | SUBCUTANEOUS | 2 refills | Status: AC

## 2022-10-23 NOTE — Telephone Encounter (Signed)
Patient with positive QuantiFERON gold.  Has history of BCG vaccine.  Immigrated from [place] to the [place] 20+ years ago.  No recent travel history or respiratory symptoms.  She is currently urgent care to have a chest x-ray, her results will be sent to the health department and she will call them to set up follow-up for treatment of TB.

## 2022-10-23 NOTE — Telephone Encounter (Signed)
Call from patient states got a call after being seen in the Clinics today from [facility] informing her of positive TB results. Patient is going to get a copy of the results to show Dr. Elaina Pattee.  Patient asked if the test could be repeated again. Informed patient that the Health Department does the treatment for the TB.States was also told that by the [facility].  States has called the Health Department and is awaiting a return call.  Will forward information to Dr. Elaina Pattee.

## 2022-10-23 NOTE — Patient Instructions (Addendum)
It was a pleasure seeing you in clinic today  We will check thyroid studies today and a urine test for diabetes  I will call with results and we can discuss if you need treatment for anxiety at that time  Please start mounjaro 2.5 mg weekly for diabetes and weight loss  You can use a free 1 month trial of this medication and we will work on getting a prior authorization for this. You may qualify for a coupon card as well which may make this more affordable   Please call and make an eye appointment  [facility] [address]  [phone]  You are likely due to  a Pap smear I would recommend follow up in a few months to have this done  Follow up in 2 months

## 2022-10-23 NOTE — Telephone Encounter (Signed)
RTC from patient has contacted [facility].  They are going to schedule a Chest Xray for patient .  Patient states depending upon results will go from there.

## 2022-10-23 NOTE — Telephone Encounter (Signed)
Results of TB test down at [facility].  Placed in box in MR to e viewed by Dr. Elaina Pattee.

## 2022-10-24 DIAGNOSIS — R7612 Nonspecific reaction to cell mediated immunity measurement of gamma interferon antigen response without active tuberculosis: Secondary | ICD-10-CM | POA: Insufficient documentation

## 2022-10-24 DIAGNOSIS — F411 Generalized anxiety disorder: Secondary | ICD-10-CM | POA: Insufficient documentation

## 2022-10-24 DIAGNOSIS — R5383 Other fatigue: Secondary | ICD-10-CM | POA: Insufficient documentation

## 2022-10-24 LAB — MICROALBUMIN / CREATININE URINE RATIO
Creatinine, Urine: 188.7 mg/dL
Microalb/Creat Ratio: 22 mg/g creat (ref 0–29)
Microalbumin, Urine: 41.1 ug/mL

## 2022-10-24 LAB — TSH: TSH: 1.35 u[IU]/mL (ref 0.450–4.500)

## 2022-10-24 LAB — HEMOGLOBIN A1C
Est. average glucose Bld gHb Est-mCnc: 146 mg/dL
Hgb A1c MFr Bld: 6.7 % — ABNORMAL HIGH (ref 4.8–5.6)

## 2022-10-24 MED ORDER — SERTRALINE HCL 25 MG PO TABS: 25.0000 mg | ORAL_TABLET | Freq: Every day | ORAL | 2 refills | Status: DC

## 2022-10-24 NOTE — Assessment & Plan Note (Addendum)
A1c 6.7%. unable to pick up Ozempic as this was costing him about thousand dollars a month.  Has been using her sister's Ozempic 0.25 mg weekly for several months.  Has not noticed any weight loss with this.  Denies any significant GI side effects.  She would like to try something that will control her diabetes to providing more weight loss plan.  I discussed this with the pharmacist.  Will start her on Mounjaro 2.5 mg weekly.  Provided her with 1 month free trial.  Will work on PA and coupon card for this.  She will call if unable to afford this.  Urine microalbumin/Cr Follow-up in 1 month

## 2022-10-24 NOTE — Progress Notes (Addendum)
Established Patient Office Visit  Subjective   Patient ID: Megan Barry, female    DOB: [birth year]  Age: 42 y.o. MRN: [id number]  Chief Complaint  Patient presents with   Follow-up    3 MONTH FOLLOW UP / DM / REQUESTING SOMETHING TO HELP WITH WEIGHT THAT THE INSURANCE WOULD COVER LIKE OZEMPIC.    Megan Barry is a 42 y.o. person living with a history listed below who presents to clinic for diabetes follow up. Please refer to problem based charting for further details and assessment and plan of current problem and chronic medical conditions.     Patient Active Problem List   Diagnosis Date Noted   Fatigue 10/24/2022   GAD (generalized anxiety disorder) 10/24/2022   Positive QuantiFERON-TB Gold test 10/24/2022   Health care maintenance 07/25/2022   Type II diabetes mellitus (H[facility]) 07/24/2021   Hypertension 07/24/2021   Preeclampsia 05/20/2012   Normal delivery 05/20/2012      ROS    Objective:     BP 119/69 (BP Location: Right Arm, Patient Position: Sitting, Cuff Size: Large)   Pulse 94   Temp 97.6 F (36.4 C) (Oral)   Ht 5\' 6"  (1.676 m)   Wt 222 lb 1.6 oz (100.7 kg)   SpO2 100%   BMI 35.85 kg/m  BP Readings from Last 3 Encounters:  10/23/22 119/69  07/25/22 (!) 136/95  04/03/22 130/84      Physical Exam Constitutional:      Appearance: She is obese.  HENT:     Head: Normocephalic and atraumatic.     Mouth/Throat:     Mouth: Mucous membranes are moist.     Pharynx: Oropharynx is clear.  Eyes:     Extraocular Movements: Extraocular movements intact.     Conjunctiva/sclera: Conjunctivae normal.     Pupils: Pupils are equal, round, and reactive to light.  Neck:     Comments: No thyromegaly  Cardiovascular:     Rate and Rhythm: Normal rate and regular rhythm.     Pulses: Normal pulses.     Heart sounds: No murmur heard.    No friction rub. No gallop.  Pulmonary:     Effort: Pulmonary effort is normal.     Breath sounds: No rhonchi or rales.   Abdominal:     General: Abdomen is flat. Bowel sounds are normal. There is no distension.     Palpations: Abdomen is soft.     Tenderness: There is no abdominal tenderness.  Musculoskeletal:        General: Normal range of motion.     Cervical back: No tenderness.     Right lower leg: No edema.     Left lower leg: No edema.  Lymphadenopathy:     Cervical: No cervical adenopathy.  Skin:    General: Skin is warm and dry.     Capillary Refill: Capillary refill takes less than 2 seconds.     Findings: No bruising or rash.     Comments: Diffuse thinning of hair  Neurological:     General: No focal deficit present.     Mental Status: She is alert and oriented to person, place, and time.  Psychiatric:        Mood and Affect: Mood normal.        Behavior: Behavior normal.      Results for orders placed or performed in visit on 10/23/22  Microalbumin / Creatinine Urine Ratio  Result Value Ref Range   Creatinine, Urine 188.7 Not  Estab. mg/dL   Microalbumin, Urine 44.0 Not Estab. ug/mL   Microalb/Creat Ratio 22 0 - 29 mg/g creat  TSH  Result Value Ref Range   TSH 1.350 0.450 - 4.500 uIU/mL  Hemoglobin A1c  Result Value Ref Range   Hgb A1c MFr Bld 6.7 (H) 4.8 - 5.6 %   Est. average glucose Bld gHb Est-mCnc 146 mg/dL    Last thyroid functions Lab Results  Component Value Date   TSH 1.350 10/23/2022      The 10-year ASCVD risk score (Arnett DK, et al., 2019) is: 0.7%    Assessment & Plan:   Problem List Items Addressed This Visit     Type II diabetes mellitus ([facility]) - Primary    A1c 6.7%. unable to pick up Ozempic as this was costing him about thousand dollars a month.  Has been using her sister's Ozempic 0.25 mg weekly for several months.  Has not noticed any weight loss with this.  Denies any significant GI side effects.  She would like to try something that will control her diabetes to providing more weight loss plan.  I discussed this with the pharmacist.  Will start  her on Mounjaro 2.5 mg weekly.  Provided her with 1 month free trial.  Will work on PA and coupon card for this.  She will call if unable to afford this.  Urine microalbumin/Cr Follow-up in 1 month       Relevant Medications   tirzepatide (MOUNJARO) 2.5 MG/0.5ML Pen   Other Relevant Orders   Microalbumin / Creatinine Urine Ratio (Completed)   Hemoglobin A1c (Completed)   Positive QuantiFERON-TB Gold test    Patient has positive QuantiFERON gold test recently as she needs records for applying for green card.  Notes prior TB test have been negative in the past.  She did get a TB vaccine as a child when living in [place].  Otherwise no other risk factors for TB.  Otherwise not having any respiratory symptoms.  Patient called after her office visit today with these results and is currently at urgent care getting chest x-ray.  She will have chest x-ray results sent to health department and follow-up with health department for further treatment of this.      GAD (generalized anxiety disorder)    Patient with generalized anxiety, poor concentration, and nervousness around social situations.  Generally feeling more tired due to difficulty sleeping.  She does not enjoy normal activities.  PHQ9 18 and GAD-7 20.  Checking thyroid status, if normal will start on Zoloft.  She declined referral for IBH.  Follow-up in 1 month..      Relevant Medications   sertraline (ZOLOFT) 25 MG tablet   Fatigue    Patient reports increased fatigue and anxiety lately.  This is associated with dry skin and hair loss.  Notes poor sleep and difficulty with concentration. States symptoms have been ongoing for about 6 months.  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  She has normal menstrual period every 30 days bleeding.  Reports some menstrual irregularity with missed periods that resoled approximately 6 months ago.  Also notes a few hairs that she shaves from her chin and deepening of her  voice. States she is often mistaken as a man on the phone.  Differential includes hypothyroidism. depression, anemia, hyperandrogenism.  Will check thyroid studies and begin treatment for anxiety.    -TSH today. -See anxiety for further details  -Follow-up in 1  month      Relevant Orders   TSH (Completed)    Return in about 2 months (around 12/23/2022).    Quincy Simmonds, MD

## 2022-10-24 NOTE — Assessment & Plan Note (Addendum)
Patient with generalized anxiety, poor concentration, and nervousness around social situations.  Generally feeling more tired due to difficulty sleeping.  She does not enjoy normal activities.  PHQ9 18 and GAD-7 20.  Checking thyroid status, if normal will start on Zoloft.  She declined referral for IBH.  Follow-up in 1 month.Marland Kitchen

## 2022-10-24 NOTE — Assessment & Plan Note (Addendum)
Patient reports increased fatigue and anxiety lately.  This is associated with dry skin and hair loss.  Notes poor sleep and difficulty with concentration. States symptoms have been ongoing for about 6 months.  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  She has normal menstrual period every 30 days bleeding.  Reports some menstrual irregularity with missed periods that resoled approximately 6 months ago.  Also notes a few hairs that she shaves from her chin and deepening of her voice. States she is often mistaken as a man on the phone.  Differential includes hypothyroidism. depression, anemia, hyperandrogenism.  Will check thyroid studies and begin treatment for anxiety.    -TSH today. -See anxiety for further details  -Follow-up in 1 month

## 2022-10-24 NOTE — Assessment & Plan Note (Addendum)
Patient has positive QuantiFERON gold test recently as she needs records for applying for green card.  Notes prior TB test have been negative in the past.  She did get a TB vaccine as a child when living in [place].  Otherwise no other risk factors for TB.  Otherwise not having any respiratory symptoms.  Patient called after her office visit today with these results and is currently at urgent care getting chest x-ray.  She will have chest x-ray results sent to health department and follow-up with health department for further treatment of this.

## 2022-10-28 ENCOUNTER — Telehealth: Payer: Self-pay

## 2022-10-28 ENCOUNTER — Other Ambulatory Visit (HOSPITAL_COMMUNITY): Payer: Self-pay

## 2022-10-28 NOTE — Telephone Encounter (Signed)
PA was attempted thru CoverMyMeds.   [employer] doesn't handle patients PA's. Will submit PA thru [employer] online portal.

## 2022-10-28 NOTE — Telephone Encounter (Signed)
Submitted thru [employer] online portal  Prior Auth (EOC) ID: [id number]

## 2022-10-29 ENCOUNTER — Encounter: Payer: Self-pay | Admitting: Student

## 2022-10-29 NOTE — Progress Notes (Signed)
Internal Medicine Clinic Attending ? ?Case discussed with Dr. Liang  At the time of the visit.  We reviewed the resident?s history and exam and pertinent patient test results.  I agree with the assessment, diagnosis, and plan of care documented in the resident?s note. ? ?

## 2022-10-29 NOTE — Telephone Encounter (Signed)
Prior Auth for patients medication MOUNJARO approved by [employer] from 10/28/22 to 10/27/23.

## 2022-11-27 ENCOUNTER — Other Ambulatory Visit: Payer: Self-pay | Admitting: Student

## 2023-02-25 NOTE — Progress Notes (Unsigned)
   CC: 3 month follow up  HPI:  Megan Barry is a 42 y.o. with medical history of HTN, DMII presenting to [facility] for a follow up. Last seen on 10/2022.   Please see problem-based list for further details, assessments, and plans.  Past Medical History:  Diagnosis Date   Gestational diabetes    insulin         Current Outpatient Medications (Other):    glucose blood (FREESTYLE LITE) test strip, Check blood sugar up to once daily   Lancets 33G MISC, Check blood sugar up to once daily   sertraline (ZOLOFT) 25 MG tablet, TAKE 1 TABLET (25 MG TOTAL) BY MOUTH DAILY.  Review of Systems:  Review of system negative unless stated in the problem list or HPI.    Physical Exam:  There were no vitals filed for this visit. Physical Exam General: NAD HENT: NCAT Lungs: CTAB, no wheeze, rhonchi or rales.  Cardiovascular: Normal heart sounds, no r/m/g, 2+ pulses in all extremities. No LE edema Abdomen: No TTP, normal bowel sounds MSK: No asymmetry or muscle atrophy.  Skin: no lesions noted on exposed skin Neuro: Alert and oriented x4. CN grossly intact Psych: Normal mood and normal affect   Assessment & Plan:   No problem-specific Assessment & Plan notes found for this encounter.   See Encounters Tab for problem based charting.  Patient Discussed with Dr. {NAMES:[id number]::"Guilloud","Hoffman","Mullen","Narendra","Vincent","Machen","Lau","Hatcher","Williams"} Megan Abbot, MD Eligha Bridegroom. [facility] Medicine Res[facility], PGY-3   DMII Repeat A1c this visit. No on any meds. Was given trial of mounjaro  HTN Elevated previously but not on any pharmacotherapy  HM Flu shot Pap smear Eye exam

## 2023-02-26 ENCOUNTER — Telehealth: Payer: Self-pay

## 2023-02-26 ENCOUNTER — Ambulatory Visit (INDEPENDENT_AMBULATORY_CARE_PROVIDER_SITE_OTHER): Payer: No Typology Code available for payment source | Admitting: Student

## 2023-02-26 ENCOUNTER — Other Ambulatory Visit: Payer: Self-pay

## 2023-02-26 ENCOUNTER — Encounter: Payer: Self-pay | Admitting: Student

## 2023-02-26 ENCOUNTER — Other Ambulatory Visit (HOSPITAL_COMMUNITY): Payer: Self-pay

## 2023-02-26 VITALS — BP 130/63 | HR 80 | Temp 98.2°F | Ht 66.0 in | Wt 225.2 lb

## 2023-02-26 DIAGNOSIS — Z23 Encounter for immunization: Secondary | ICD-10-CM | POA: Diagnosis not present

## 2023-02-26 DIAGNOSIS — Z0000 Encounter for general adult medical examination without abnormal findings: Secondary | ICD-10-CM

## 2023-02-26 DIAGNOSIS — E1169 Type 2 diabetes mellitus with other specified complication: Secondary | ICD-10-CM

## 2023-02-26 DIAGNOSIS — I10 Essential (primary) hypertension: Secondary | ICD-10-CM

## 2023-02-26 DIAGNOSIS — E119 Type 2 diabetes mellitus without complications: Secondary | ICD-10-CM

## 2023-02-26 DIAGNOSIS — E785 Hyperlipidemia, unspecified: Secondary | ICD-10-CM

## 2023-02-26 DIAGNOSIS — Z1231 Encounter for screening mammogram for malignant neoplasm of breast: Secondary | ICD-10-CM

## 2023-02-26 LAB — POCT GLYCOSYLATED HEMOGLOBIN (HGB A1C): Hemoglobin A1C: 6.9 % — AB (ref 4.0–5.6)

## 2023-02-26 LAB — GLUCOSE, CAPILLARY: Glucose-Capillary: 128 mg/dL — ABNORMAL HIGH (ref 70–99)

## 2023-02-26 MED ORDER — ROSUVASTATIN CALCIUM 10 MG PO TABS
10.0000 mg | ORAL_TABLET | Freq: Every day | ORAL | 11 refills | Status: DC
Start: 2023-02-26 — End: 2023-07-11

## 2023-02-26 MED ORDER — EMPAGLIFLOZIN 10 MG PO TABS
10.0000 mg | ORAL_TABLET | Freq: Every day | ORAL | 11 refills | Status: DC
Start: 2023-02-26 — End: 2023-07-11

## 2023-02-26 NOTE — Assessment & Plan Note (Addendum)
BMI>35 associated with HTN and DM. Excess calories and sedentary job contributing and discussed today. Patient is unable to afford GLP1-RA medications at this time.  Will monitor with lifestyle modifications at this time -Offered nutrition services; deferred. Please offer next time -May need to consider non-GLP-1 medications at next office visit

## 2023-02-26 NOTE — Assessment & Plan Note (Signed)
Vitals:   02/26/23 0842 02/26/23 1034  BP: (!) 126/93 130/63  Patient with borderline stage I hypertension. Discussed lifestyle modications as above. Provided with blood pressure log to monitor at home.  -Return in 1 month for follow up -Consider ARB if patient needs to be started on antihypertensive for renal protection in this patient with DM

## 2023-02-26 NOTE — Assessment & Plan Note (Signed)
Started on Crestor 10 mg -CMP today

## 2023-02-26 NOTE — Assessment & Plan Note (Addendum)
Last A1c:  Lab Results  Component Value Date   HGBA1C 6.9 (A) 02/26/2023   Diet: Patient has had a difficult time keeping up with dietary changes, especially around menstrual period. Discussed increasing protein and fiber rich foods and decrease frequency of sweets daily. She also works at desk job at a call center and has not been able to incorporate as much movement into her days.  Last Urine microalbumin:10/2022 22 ARB/ACEi?:not indicated Lipid panel done, patient not on medium intensity statin HTN? Controlled? Ophthalmology appointment - scheduled Current medications: none. Previously on metformin but with significant GI side effects. Trialed Mounjaro and Ozempic but costly out of pocket copay is prohibitive.  -Will try PA for Jardiance 10 mg today. -Return in 1 month for side effect monitoring

## 2023-02-26 NOTE — Progress Notes (Signed)
Subjective:  CC: Diabetes follow up  HPI:  Megan Barry is a 42 y.o. female with a past medical history stated below and presents today for diabetes,  follow up. Please see problem based assessment and plan for additional details.  Past Medical History:  Diagnosis Date   Gestational diabetes    insulin    Current Outpatient Medications on File Prior to Visit  Medication Sig Dispense Refill   glucose blood (FREESTYLE LITE) test strip Check blood sugar up to once daily 100 each 3   Lancets 33G MISC Check blood sugar up to once daily 100 each 3   sertraline (ZOLOFT) 25 MG tablet TAKE 1 TABLET (25 MG TOTAL) BY MOUTH DAILY. 90 tablet 1   [DISCONTINUED] albuterol (PROVENTIL HFA;VENTOLIN HFA) 108 (90 BASE) MCG/ACT inhaler Inhale 1-2 puffs into the lungs every 6 (six) hours as needed for wheezing or shortness of breath. 1 Inhaler 1   No current facility-administered medications on file prior to visit.    Family History  Problem Relation Age of Onset   Diabetes Mother    Hypertension Mother    Breast cancer Neg Hx     Social History   Socioeconomic History   Marital status: Married    Spouse name: Not on file   Number of children: Not on file   Years of education: Not on file   Highest education level: Not on file  Occupational History   Not on file  Tobacco Use   Smoking status: Never   Smokeless tobacco: Never  Vaping Use   Vaping status: Never Used  Substance and Sexual Activity   Alcohol use: Not Currently    Comment: rare   Drug use: No   Sexual activity: Yes    Birth control/protection: None  Other Topics Concern   Not on file  Social History Narrative   Not on file   Social Determinants of Health   Financial Resource Strain: Medium Risk (07/25/2022)   Overall Financial Resource Strain (CARDIA)    Difficulty of Paying Living Expenses: Somewhat hard  Food Insecurity: No Food Insecurity (04/03/2022)   Hunger Vital Sign    Worried About Running Out of  Food in the Last Year: Never true    Ran Out of Food in the Last Year: Never true  Transportation Needs: No Transportation Needs (07/25/2022)   PRAPARE - Administrator, Civil Service (Medical): No    Lack of Transportation (Non-Medical): No  Physical Activity: Insufficiently Active (07/25/2022)   Exercise Vital Sign    Days of Exercise per Week: 1 day    Minutes of Exercise per Session: 30 min  Stress: No Stress Concern Present (07/25/2022)   [employer] of Occupational Health - Occupational Stress Questionnaire    Feeling of Stress : Only a little  Social Connections: Moderately Isolated (07/25/2022)   Social Connection and Isolation Panel [NHANES]    Frequency of Communication with Friends and Family: More than three times a week    Frequency of Social Gatherings with Friends and Family: More than three times a week    Attends Religious Services: Never    Database administrator or Organizations: No    Attends Banker Meetings: Never    Marital Status: Married  Catering manager Violence: Not At Risk (07/25/2022)   Humiliation, Afraid, Rape, and Kick questionnaire    Fear of Current or Ex-Partner: No    Emotionally Abused: No    Physically Abused: No  Sexually Abused: No    Review of Systems: ROS negative except for what is noted on the assessment and plan.  Objective:   Vitals:   02/26/23 0842 02/26/23 1034  BP: (!) 126/93 130/63  Pulse: 89 80  Temp: 98.2 F (36.8 C)   TempSrc: Oral   SpO2: 92%   Weight: 225 lb 3.2 oz (102.2 kg)   Height: 5\' 6"  (1.676 m)     Physical Exam: Constitutional: well-appearing woman in no acute distress HENT: normocephalic atraumatic, mucous membranes moist Eyes: conjunctiva non-erythematous Neck: supple Cardiovascular: regular rate and rhythm, no m/r/g Pulmonary/Chest: normal work of breathing on room air, lungs clear to auscultation bilaterally Abdominal: soft, non-tender, non-distended MSK: normal bulk  and tone Neurological: alert & oriented x 3 Skin: warm and dry Visual examination without evidence of asymmetric nevus, raised or bleeding lesions Psych: Pleasant mood and affect        02/26/2023    8:47 AM  Depression screen PHQ 2/9  Decreased Interest 0  Down, Depressed, Hopeless 0  PHQ - 2 Score 0  Altered sleeping 0  Tired, decreased energy 0  Change in appetite 0  Feeling bad or failure about yourself  0  Trouble concentrating 0  Moving slowly or fidgety/restless 0  Suicidal thoughts 0  PHQ-9 Score 0  Difficult doing work/chores Not difficult at all       10/24/2022   10:51 AM  GAD 7 : Generalized Anxiety Score  Nervous, Anxious, on Edge 3  Control/stop worrying 3  Worry too much - different things 3  Trouble relaxing 3  Restless 3  Easily annoyed or irritable 3  Afraid - awful might happen 2  Total GAD 7 Score 20     Assessment & Plan:   Morbid obesity (HCC) BMI>35 associated with HTN and DM. Excess calories and sedentary job contributing and discussed today. Patient is unable to afford GLP1-RA medications at this time.  Will monitor with lifestyle modifications at this time -Offered nutrition services; deferred. Please offer next time -May need to consider non-GLP-1 medications at next office visit  Type II diabetes mellitus (HCC) Last A1c:  Lab Results  Component Value Date   HGBA1C 6.9 (A) 02/26/2023   Diet: Patient has had a difficult time keeping up with dietary changes, especially around menstrual period. Discussed increasing protein and fiber rich foods and decrease frequency of sweets daily. She also works at desk job at a call center and has not been able to incorporate as much movement into her days.  Last Urine microalbumin:10/2022 22 ARB/ACEi?:not indicated Lipid panel done, patient not on medium intensity statin HTN? Controlled? Ophthalmology appointment - scheduled Current medications: none. Previously on metformin but with significant GI side  effects. Trialed Mounjaro and Ozempic but costly out of pocket copay is prohibitive.  -Will try PA for Jardiance 10 mg today. -Return in 1 month for side effect monitoring  Hypertension Vitals:   02/26/23 0842 02/26/23 1034  BP: (!) 126/93 130/63  Patient with borderline stage I hypertension. Discussed lifestyle modications as above. Provided with blood pressure log to monitor at home.  -Return in 1 month for follow up -Consider ARB if patient needs to be started on antihypertensive for renal protection in this patient with DM   Health care maintenance Flu shot today Referred to mammogram  Hyperlipidemia associated with type 2 diabetes mellitus (HCC) Started on Crestor 10 mg -CMP today    Return in about 4 weeks (around 03/26/2023) for Diabetes, HTN, and  obesity follow up.  Patient discussed with Dr. Olene Floss, MD [facility] Internal Medicine Program - PGY-2 02/26/2023, 10:58 AM

## 2023-02-26 NOTE — Patient Instructions (Addendum)
Thank you, Ms.Linzie Collin for allowing [place] to provide your care today. Today we discussed   Diabetes Mellitus Your A1c today shows that your diabetes is 6.9. I am working with our pharmacist to start you on a diabetes regimen that your insurance will cover. I am also starting you on Crestor 10 mg daily, which is for your cholesterol to minimize the risk of cardiovascular disease. Diabetes puts you at higher risk for heart disease and stroke events.  Hypertension Your blood pressure today was mildly elevated. We want the top number to be <130 and the bottom number to be less than 90. Your top number was within this range but the bottom number was elevated Please be mindful of the amount of sodium/salt in your diet  Documentation:   Health Maintenance - you have been referred to the eye doctor, please call them to make an appointment [facility] [address]  [phone]  -Remember to get your annual dental exam. You can call the back of your insurance card to see where this can be done.  Annual flu shot - you received this today Influenza (flu) vaccines (often called "flu shots") are vaccines that protect against the three influenza viruses that research indicates will be most common during the upcoming season.  I have ordered the following labs for you:  Lab Orders         Glucose, capillary         CMP14 + Anion Gap         POC Hbg A1C      I will call if any are abnormal. All of your labs can be accessed through "My Chart".   My Chart Access: [email or web address]  Please follow-up in: 3 months    We look forward to seeing you next time. Please call our clinic at [phone] if you have any questions or concerns. The best time to call is Monday-Friday from 9am-4pm, but there is someone available 24/7. If after hours or the weekend, call the main hospital number and ask for the Internal Medicine  Resident On-Call. If you need medication refills, please notify your pharmacy one week in advance and they will send [place] a request.   Thank you for letting us take part in your care. Wishing you the best!  Morene Crocker, MD 02/26/2023, 9:16 AM [facility] Internal Medicine Resident, PGY-2

## 2023-02-26 NOTE — Assessment & Plan Note (Signed)
Flu shot today Referred to mammogram

## 2023-02-26 NOTE — Telephone Encounter (Signed)
Pa for pt ( JARDIANCE 10 MG TAB )  came through on cover my meds .Marland Kitchen Was submitted but the message that came back .Marland Kitchen Stated that insurance  that is being used does not handle pt  PA ... Copy was sent to pharmacy  to re run Rx with correct insurance info

## 2023-02-27 ENCOUNTER — Other Ambulatory Visit: Payer: Self-pay | Admitting: Student

## 2023-02-27 DIAGNOSIS — I10 Essential (primary) hypertension: Secondary | ICD-10-CM

## 2023-02-27 LAB — CMP14 + ANION GAP
ALT: 27 IU/L (ref 0–32)
AST: 18 IU/L (ref 0–40)
Albumin: 3.9 g/dL (ref 3.9–4.9)
Alkaline Phosphatase: 59 IU/L (ref 44–121)
Anion Gap: 14 mmol/L (ref 10.0–18.0)
BUN/Creatinine Ratio: 17 (ref 9–23)
BUN: 9 mg/dL (ref 6–24)
Bilirubin Total: 0.3 mg/dL (ref 0.0–1.2)
CO2: 22 mmol/L (ref 20–29)
Calcium: 9 mg/dL (ref 8.7–10.2)
Chloride: 102 mmol/L (ref 96–106)
Creatinine, Ser: 0.53 mg/dL — ABNORMAL LOW (ref 0.57–1.00)
Globulin, Total: 2.7 g/dL (ref 1.5–4.5)
Glucose: 120 mg/dL — ABNORMAL HIGH (ref 70–99)
Potassium: 4.3 mmol/L (ref 3.5–5.2)
Sodium: 138 mmol/L (ref 134–144)
Total Protein: 6.6 g/dL (ref 6.0–8.5)
eGFR: 119 mL/min/{1.73_m2} (ref >59)

## 2023-02-27 MED ORDER — BLOOD PRESSURE CUFF MISC
1.0000 [IU] | Freq: Once | 0 refills | Status: AC
Start: 2023-02-27 — End: 2023-02-27

## 2023-02-27 NOTE — Progress Notes (Signed)
Normal LFTs. Cr mildly decreased, continue to monitor at follow up.  Ordered blood pressure cuff for BP monitoring Patient called and aware

## 2023-03-04 NOTE — Progress Notes (Signed)
Internal Medicine Clinic Attending  Case discussed with the resident at the time of the visit.  We reviewed the resident's history and exam and pertinent patient test results.  I agree with the assessment, diagnosis, and plan of care documented in the resident's note.  

## 2023-03-20 ENCOUNTER — Telehealth: Payer: Self-pay

## 2023-03-20 ENCOUNTER — Encounter: Payer: Self-pay | Admitting: Student

## 2023-03-20 NOTE — Telephone Encounter (Signed)
Prior Authorization for patient [place] Pepper) was submitted on Rx Benefits with last office notes and labs awaiting approval or denial.  Prior Authorization request details: Prior Auth (EOC) ID: [id number] Drug/Service Name: JARDIANCE 10 MG TABLET Patient: [facility] Date Requested: 03/20/2023 11:55:38 AM   MemberID: [id number] DOB: [birth year]

## 2023-03-20 NOTE — Telephone Encounter (Signed)
Decision:Approved Drug/Service Name: JARDIANCE 10 MG TABLET Physician/Nurse: Morene Crocker EOC ID: [id number] Status: Approved Date Requested: 03/20/2023 11:55:40 Date Closed: 03/20/2023 12:09:27 Dispensing Location:    Est.Time of Completion: N/A

## 2023-05-28 ENCOUNTER — Encounter: Payer: No Typology Code available for payment source | Admitting: Student

## 2023-05-30 ENCOUNTER — Other Ambulatory Visit: Payer: Self-pay | Admitting: Student

## 2023-07-11 ENCOUNTER — Ambulatory Visit (INDEPENDENT_AMBULATORY_CARE_PROVIDER_SITE_OTHER): Payer: No Typology Code available for payment source | Admitting: Student

## 2023-07-11 VITALS — BP 125/79 | HR 103 | Temp 97.6°F | Ht 66.0 in | Wt 235.0 lb

## 2023-07-11 DIAGNOSIS — E1169 Type 2 diabetes mellitus with other specified complication: Secondary | ICD-10-CM

## 2023-07-11 DIAGNOSIS — E669 Obesity, unspecified: Secondary | ICD-10-CM

## 2023-07-11 DIAGNOSIS — E785 Hyperlipidemia, unspecified: Secondary | ICD-10-CM

## 2023-07-11 DIAGNOSIS — Z6837 Body mass index (BMI) 37.0-37.9, adult: Secondary | ICD-10-CM

## 2023-07-11 DIAGNOSIS — F411 Generalized anxiety disorder: Secondary | ICD-10-CM | POA: Diagnosis not present

## 2023-07-11 DIAGNOSIS — E119 Type 2 diabetes mellitus without complications: Secondary | ICD-10-CM

## 2023-07-11 DIAGNOSIS — Z7985 Long-term (current) use of injectable non-insulin antidiabetic drugs: Secondary | ICD-10-CM

## 2023-07-11 LAB — POCT GLYCOSYLATED HEMOGLOBIN (HGB A1C): Hemoglobin A1C: 6.8 % — AB (ref 4.0–5.6)

## 2023-07-11 LAB — GLUCOSE, CAPILLARY: Glucose-Capillary: 176 mg/dL — ABNORMAL HIGH (ref 70–99)

## 2023-07-11 MED ORDER — ROSUVASTATIN CALCIUM 10 MG PO TABS: 10.0000 mg | ORAL_TABLET | Freq: Every day | ORAL | 11 refills | Status: DC

## 2023-07-11 MED ORDER — MOUNJARO 2.5 MG/0.5ML ~~LOC~~ SOAJ: 2.5000 mg | SUBCUTANEOUS | 3 refills | Status: DC

## 2023-07-11 MED ORDER — SERTRALINE HCL 50 MG PO TABS: 50.0000 mg | ORAL_TABLET | Freq: Every day | ORAL | 2 refills | Status: DC

## 2023-07-11 NOTE — Progress Notes (Signed)
Established Patient Office Visit  Subjective   Patient ID: Megan Barry, female    DOB: 10/29/1980  Age: 43 y.o. MRN: [id number]  Chief Complaint  Patient presents with   Follow-up    Routine office visit  DM / FMLA form to be completed / medication refill    Maripaz Sylvain is a 43 y.o. who presents to the clinic for follow-up of diabetes. Please see problem based assessment and plan for additional details.  Patient Active Problem List   Diagnosis Date Noted   Morbid obesity (HCC) 02/26/2023   Hyperlipidemia associated with type 2 diabetes mellitus (HCC) 02/26/2023   Fatigue 10/24/2022   GAD (generalized anxiety disorder) 10/24/2022   Positive QuantiFERON-TB Gold test 10/24/2022   Health care maintenance 07/25/2022   Type II diabetes mellitus (HCC) 07/24/2021   Hypertension 07/24/2021     Objective:     BP 125/79 (BP Location: Left Arm, Patient Position: Sitting, Cuff Size: Large)   Pulse (!) 103   Temp 97.6 F (36.4 C) (Oral)   Ht 5\' 6"  (1.676 m)   Wt 235 lb (106.6 kg)   SpO2 97%   BMI 37.93 kg/m  BP Readings from Last 3 Encounters:  07/11/23 125/79  02/26/23 130/63  10/23/22 119/69   Wt Readings from Last 3 Encounters:  07/11/23 235 lb (106.6 kg)  02/26/23 225 lb 3.2 oz (102.2 kg)  10/23/22 222 lb 1.6 oz (100.7 kg)      Physical Exam Vitals reviewed.  Constitutional:      General: She is not in acute distress.    Appearance: She is obese. She is not ill-appearing, toxic-appearing or diaphoretic.  Cardiovascular:     Rate and Rhythm: Normal rate and regular rhythm.     Heart sounds: No murmur heard. Pulmonary:     Effort: Pulmonary effort is normal. No respiratory distress.     Breath sounds: Normal breath sounds. No stridor. No wheezing, rhonchi or rales.  Skin:    General: Skin is warm and dry.  Psychiatric:        Mood and Affect: Mood normal.        Behavior: Behavior normal.      Results for orders placed or performed in visit on 07/11/23   Glucose, capillary  Result Value Ref Range   Glucose-Capillary 176 (H) 70 - 99 mg/dL  POC Hbg W0J  Result Value Ref Range   Hemoglobin A1C 6.8 (A) 4.0 - 5.6 %   HbA1c POC (<> result, manual entry)     HbA1c, POC (prediabetic range)     HbA1c, POC (controlled diabetic range)      Last metabolic panel Lab Results  Component Value Date   GLUCOSE 120 (H) 02/26/2023   NA 138 02/26/2023   K 4.3 02/26/2023   CL 102 02/26/2023   CO2 22 02/26/2023   BUN 9 02/26/2023   CREATININE 0.53 (L) 02/26/2023   EGFR 119 02/26/2023   CALCIUM 9.0 02/26/2023   PROT 6.6 02/26/2023   ALBUMIN 3.9 02/26/2023   LABGLOB 2.7 02/26/2023   BILITOT 0.3 02/26/2023   ALKPHOS 59 02/26/2023   AST 18 02/26/2023   ALT 27 02/26/2023   ANIONGAP 9 10/13/2018   Last lipids Lab Results  Component Value Date   CHOL 141 07/11/2021   HDL 46 07/11/2021   LDLCALC 69 07/11/2021   TRIG 150 (H) 07/11/2021   CHOLHDL 3.1 07/11/2021   Last hemoglobin A1c Lab Results  Component Value Date   HGBA1C 6.8 (A)  07/11/2023      The 10-year ASCVD risk score (Arnett DK, et al., 2019) is: 0.9%    Assessment & Plan:   Problem List Items Addressed This Visit       Endocrine   Type II diabetes mellitus (HCC) - Primary   Patient presents with a history of T2Dm with a prior A1c of 6.9  in September 2024.  Patient's A1c today is 6.8.  They are on a regimen of ozempic 0.25 mg for the past 6 weeks.  Patient reported that she is using Ozempic that was prescribed to her in 2023, she also stated that she was unable to afford the Jardiance that was prescribed at her prior appointment.  Patient denies hypoglycemia.  Plan: -Will start Mounjaro 2.5 mg weekly -Ophthalmology referral placed -Lipid profile ordered for today, patient reported that she has not been taking Crestor -Crestor reordered      Relevant Medications   tirzepatide (MOUNJARO) 2.5 MG/0.5ML Pen   rosuvastatin (CRESTOR) 10 MG tablet   Other Relevant Orders    POC Hbg A1C (Completed)   Ambulatory referral to Ophthalmology   Hyperlipidemia associated with type 2 diabetes mellitus (HCC)   Relevant Medications   tirzepatide (MOUNJARO) 2.5 MG/0.5ML Pen   rosuvastatin (CRESTOR) 10 MG tablet   Other Relevant Orders   Lipid Profile     Other   GAD (generalized anxiety disorder)   Patient has a history of generalized anxiety disorder with a GAD-7 score of 20 in May 2024.  Today her GAD-7 score did improved to 14 on a regimen of Zoloft 25 mg patient reports that the Zoloft is helping some.  She denies any SI/HI. Plan: -We will increase Zoloft to 50 mg       Relevant Medications   sertraline (ZOLOFT) 50 MG tablet   Other Visit Diagnoses       Type 2 diabetes mellitus with obesity (HCC)       Relevant Medications   tirzepatide (MOUNJARO) 2.5 MG/0.5ML Pen   rosuvastatin (CRESTOR) 10 MG tablet        Return in about 3 months (around 10/08/2023).    Faith Rogue, DO

## 2023-07-11 NOTE — Assessment & Plan Note (Signed)
Patient presents with a history of T2Dm with a prior A1c of 6.9  in September 2024.  Patient's A1c today is 6.8.  They are on a regimen of ozempic 0.25 mg for the past 6 weeks.  Patient reported that she is using Ozempic that was prescribed to her in 2023, she also stated that she was unable to afford the Jardiance that was prescribed at her prior appointment.  Patient denies hypoglycemia.  Plan: -Will start Mounjaro 2.5 mg weekly -Ophthalmology referral placed -Lipid profile ordered for today, patient reported that she has not been taking Crestor -Crestor reordered

## 2023-07-11 NOTE — Patient Instructions (Signed)
Thank you, Ms.Linzie Collin for allowing [place] to provide your care today. Today we discussed diabetes and anxiety.    I have ordered the following labs for you:   Lab Orders         Glucose, capillary         Lipid Profile         POC Hbg A1C        Referrals ordered today:    Referral Orders         Ambulatory referral to Ophthalmology      I have ordered the following medication/changed the following medications:   Stop the following medications: Medications Discontinued During This Encounter  Medication Reason   empagliflozin (JARDIANCE) 10 MG TABS tablet Cost of medication   sertraline (ZOLOFT) 25 MG tablet      Start the following medications: Meds ordered this encounter  Medications   tirzepatide (MOUNJARO) 2.5 MG/0.5ML Pen    Sig: Inject 2.5 mg into the skin once a week.    Dispense:  2 mL    Refill:  3   sertraline (ZOLOFT) 50 MG tablet    Sig: Take 1 tablet (50 mg total) by mouth daily.    Dispense:  30 tablet    Refill:  2     Follow up: 3 months for diabetes and anxiety   Remember: To call us if you have trouble getting the mounjaro   Should you have any questions or concerns please call the internal medicine clinic at [phone].     Please note that our late policy has changed.  If you are more than 15 minutes late to your appointment, you may be asked to reschedule your appointment.  Dr. Hessie Diener, D.O. [facility]

## 2023-07-11 NOTE — Assessment & Plan Note (Signed)
Patient has a history of generalized anxiety disorder with a GAD-7 score of 20 in May 2024.  Today her GAD-7 score did improved to 14 on a regimen of Zoloft 25 mg patient reports that the Zoloft is helping some.  She denies any SI/HI. Plan: -We will increase Zoloft to 50 mg

## 2023-07-12 LAB — LIPID PANEL
Chol/HDL Ratio: 2.7 {ratio} (ref 0.0–4.4)
Cholesterol, Total: 144 mg/dL (ref 100–199)
HDL: 54 mg/dL (ref >39)
LDL Chol Calc (NIH): 64 mg/dL (ref 0–99)
Triglycerides: 153 mg/dL — ABNORMAL HIGH (ref 0–149)
VLDL Cholesterol Cal: 26 mg/dL (ref 5–40)

## 2023-07-14 NOTE — Progress Notes (Signed)
 Internal Medicine Clinic Attending  Case discussed with the resident at the time of the visit.  We reviewed the resident's history and exam and pertinent patient test results.  I agree with the assessment, diagnosis, and plan of care documented in the resident's note.

## 2023-07-15 ENCOUNTER — Telehealth: Payer: No Typology Code available for payment source

## 2023-07-15 DIAGNOSIS — J069 Acute upper respiratory infection, unspecified: Secondary | ICD-10-CM | POA: Diagnosis not present

## 2023-07-16 MED ORDER — FLUTICASONE PROPIONATE 50 MCG/ACT NA SUSP: 2.0000 | Freq: Every day | NASAL | 0 refills | Status: AC

## 2023-07-16 MED ORDER — BENZONATATE 100 MG PO CAPS: ORAL_CAPSULE | ORAL | 0 refills | Status: AC

## 2023-07-16 NOTE — Progress Notes (Signed)

## 2023-07-17 ENCOUNTER — Telehealth: Payer: Self-pay

## 2023-07-17 NOTE — Telephone Encounter (Signed)
 FMLA paperwork has been completed and ready for pick-up. Called pt to informed her regarding her FMLA, but no answer. LVM to called back if she would like for me to fax the paperwork.

## 2023-08-05 ENCOUNTER — Encounter: Payer: Self-pay | Admitting: Student

## 2023-08-05 DIAGNOSIS — F411 Generalized anxiety disorder: Secondary | ICD-10-CM

## 2023-08-07 LAB — HM DIABETES EYE EXAM

## 2023-08-08 MED ORDER — SERTRALINE HCL 25 MG PO TABS: 25.0000 mg | ORAL_TABLET | Freq: Every day | ORAL | 2 refills | Status: DC

## 2023-10-12 ENCOUNTER — Other Ambulatory Visit: Payer: Self-pay | Admitting: Student

## 2023-10-12 DIAGNOSIS — F411 Generalized anxiety disorder: Secondary | ICD-10-CM

## 2023-10-13 NOTE — Telephone Encounter (Signed)
 Medication sent to pharmacy

## 2023-11-07 ENCOUNTER — Telehealth: Payer: Self-pay

## 2023-11-07 NOTE — Telephone Encounter (Signed)
 Prior Authorization for patient (MOUNJARO  2.5 MG/0.5 ML PEN) has been submitted to rx benefits with last office notes and labs awaiting approval or denial.  Prior Auth (EOC) ID: [id number] Drug/Service Name: MOUNJARO  2.5 MG/0.5 ML PEN Patient: [facility] Date Requested: 11/07/2023 11:51:50 AM   MemberID: [id number] DOB: 10/14/1980

## 2023-11-10 NOTE — Telephone Encounter (Signed)
 Drug/Service Name: MOUNJARO  2.5 MG/0.5 ML PEN Physician/Nurse: Aurora Lees EOC ID: [id number] Status: Approved Date Requested: 11/07/2023 11:51:52 Date Closed: 11/07/2023 12:08:05 Dispensing Location:    Est.Time of Completion: N/A

## 2023-12-25 ENCOUNTER — Other Ambulatory Visit: Payer: Self-pay | Admitting: Student

## 2023-12-25 DIAGNOSIS — F411 Generalized anxiety disorder: Secondary | ICD-10-CM

## 2023-12-26 ENCOUNTER — Encounter: Payer: Self-pay | Admitting: *Deleted

## 2023-12-26 NOTE — Telephone Encounter (Signed)
 LOV - 07/11/23 with f/u in 3 months. Called pt to schedule an appt - no answer, left message on pt's vm to call the office.

## 2023-12-29 NOTE — Telephone Encounter (Signed)
 Spoke with the patient. Appt sch for:  Name: Megan Barry, Megan Barry MRN: [id number]  Date: 01/12/2024 Status: Sch  Time: 8:45 AM Length: 60  Visit Type: OPEN ESTABLISHED [726] Copay: $0.00  Provider: Benuel Braun, DO       Copied from CRM [id number]. Topic: Appointments - Scheduling Inquiry for Clinic >> Dec 26, 2023  9:19 AM Miquel SAILOR wrote: Reason for CRM: Patient calling to schedule Physical and sign FMLA paperwork. Needs call back to confirm they can do this on the same visit. Called office n/a. [phone]

## 2024-01-12 ENCOUNTER — Other Ambulatory Visit: Payer: Self-pay

## 2024-01-12 ENCOUNTER — Ambulatory Visit

## 2024-01-12 ENCOUNTER — Other Ambulatory Visit (HOSPITAL_COMMUNITY): Payer: Self-pay

## 2024-01-12 ENCOUNTER — Other Ambulatory Visit (HOSPITAL_COMMUNITY)
Admission: RE | Admit: 2024-01-12 | Discharge: 2024-01-12 | Disposition: A | Discharge status: Home / Self Care | Source: Ambulatory Visit | Attending: Internal Medicine | Admitting: Internal Medicine

## 2024-01-12 VITALS — BP 116/83 | HR 87 | Temp 97.6°F | Ht 66.0 in | Wt 245.2 lb

## 2024-01-12 DIAGNOSIS — R0683 Snoring: Secondary | ICD-10-CM

## 2024-01-12 DIAGNOSIS — R5383 Other fatigue: Secondary | ICD-10-CM | POA: Diagnosis not present

## 2024-01-12 DIAGNOSIS — E1169 Type 2 diabetes mellitus with other specified complication: Secondary | ICD-10-CM

## 2024-01-12 DIAGNOSIS — Z124 Encounter for screening for malignant neoplasm of cervix: Secondary | ICD-10-CM | POA: Insufficient documentation

## 2024-01-12 DIAGNOSIS — F411 Generalized anxiety disorder: Secondary | ICD-10-CM

## 2024-01-12 DIAGNOSIS — Z0000 Encounter for general adult medical examination without abnormal findings: Secondary | ICD-10-CM

## 2024-01-12 DIAGNOSIS — Z9229 Personal history of other drug therapy: Secondary | ICD-10-CM

## 2024-01-12 DIAGNOSIS — E785 Hyperlipidemia, unspecified: Secondary | ICD-10-CM

## 2024-01-12 DIAGNOSIS — Z23 Encounter for immunization: Secondary | ICD-10-CM

## 2024-01-12 DIAGNOSIS — N951 Menopausal and female climacteric states: Secondary | ICD-10-CM

## 2024-01-12 DIAGNOSIS — E119 Type 2 diabetes mellitus without complications: Secondary | ICD-10-CM

## 2024-01-12 DIAGNOSIS — I10 Essential (primary) hypertension: Secondary | ICD-10-CM

## 2024-01-12 LAB — POCT GLYCOSYLATED HEMOGLOBIN (HGB A1C): Hemoglobin A1C: 7.1 % — AB (ref 4.0–5.6)

## 2024-01-12 LAB — GLUCOSE, CAPILLARY: Glucose-Capillary: 163 mg/dL — ABNORMAL HIGH (ref 70–99)

## 2024-01-12 MED ORDER — ROSUVASTATIN CALCIUM 10 MG PO TABS: 10.0000 mg | ORAL_TABLET | Freq: Every day | ORAL | 11 refills | Status: AC

## 2024-01-12 MED ORDER — SERTRALINE HCL 25 MG PO TABS: 25.0000 mg | ORAL_TABLET | Freq: Every day | ORAL | 3 refills | Status: DC

## 2024-01-12 NOTE — Assessment & Plan Note (Addendum)
 A1C 07/11/2023 = 6.8 A1C 01/12/2024 = 7.1 Patient previously on Mounjaro  and Ozempic . She has not been able to afford these medications for about 1 month. When she was on Mounjaro , she was losing weight and feeling better. The ozempic  caused constipation and dehydration, but overall was working. She would like to be on Mounjaro  again, but the cost barrier is prohibitive. Patient reported diet overall mostly fast food and limited/no exercise. Weight gain of 25 pounds in 1 month.  -Messaged Medication Assistance to assess cost barrier to restart Mounjaro  -Diabetic Nutrition Counseling ordered  -spoke with patient about importance of eating well and exercising daily -follow up 3 mo for A1C -Diabetic Foot Exam 01/12/2024 -Patient saw Ophthalmology a couple of months ago, reported WNL

## 2024-01-12 NOTE — Assessment & Plan Note (Addendum)
 Patient reported last menstrual period 2 weeks ago but experiencing hot flashes, vaginal dryness, fatigue, hair falling out, and weight gain (25 pounds in 1 month) -weight gain correlated with stopping Mounjaro  (cost prohibitive)  -repeat TSH, if WNL, consider further workup/management for perimenopause such as topical minoxidil  for hair loss and vaginal estrogen for vaginal dryness. If ineffective, escalate to PO options.

## 2024-01-12 NOTE — Progress Notes (Signed)
 Complete physical exam  Patient: Megan Barry   DOB: 05/24/1981   42 y.o. Female  MRN: [id number]  Subjective:    Chief Complaint  Patient presents with   Annual Exam    Routine office visit with medication refill and pap smear (LMP- jULY 15.2025)    Megan Barry is a 43 y.o. female who presents today for a complete physical exam. She reports consuming an unhealthy diet overall, mostly fast food due to busy schedule, cost, and child care burden. For exercise, she will go to the pool to swim occasionally, but overall she lives a stagnant lifestyle. She notes sitting about 6 hours a day for her job and the only exercise she gets daily is cleaning her house. She notes wanting to be more active, but just feeling exhausted at the end of the day.   She generally feels fairly well, however is having hot flashes, fatigue, and weight gain (25 pounds in 1 month). She reports sleeping fairly well. She does have additional problems to discuss today.   Last Office Visit was 07/11/2023 with Dr. Kandis regarding Type 2 Diabetes, Hyperlipidemia, and GAD.  Most recent fall risk assessment:    01/12/2024    9:03 AM  Fall Risk   Falls in the past year? 0  Number falls in past yr: 0  Injury with Fall? 0  Risk for fall due to : No Fall Risks  Follow up Falls evaluation completed     Most recent depression screenings:    07/11/2023    8:59 AM 02/26/2023    8:47 AM  PHQ 2/9 Scores  PHQ - 2 Score 4 0  PHQ- 9 Score 13 0    Dental Appintment on the August 12th.   Patient Care Team: Norrine Sharper, MD as PCP - General   Outpatient Medications Prior to Visit  Medication Sig   benzonatate  (TESSALON ) 100 MG capsule Take 1-2 caps PO TID PRN   Blood Pressure Monitoring (BLOOD PRESSURE CUFF) MISC 1 Units by Does not apply route once for 1 dose. Check blood pressure daily   fluticasone  (FLONASE ) 50 MCG/ACT nasal spray Place 2 sprays into both nostrils daily.   glucose blood (FREESTYLE LITE) test  strip Check blood sugar up to once daily   Lancets 33G MISC Check blood sugar up to once daily   rosuvastatin  (CRESTOR ) 10 MG tablet Take 1 tablet (10 mg total) by mouth daily.   sertraline  (ZOLOFT ) 25 MG tablet Take 1 tablet by mouth daily.   tirzepatide  (MOUNJARO ) 2.5 MG/0.5ML Pen Inject 2.5 mg into the skin once a week.   No facility-administered medications prior to visit.    Review of Systems  Constitutional:  Positive for malaise/fatigue. Negative for chills and fever.  HENT:  Negative for congestion, hearing loss and tinnitus.   Respiratory:  Negative for cough and shortness of breath.   Cardiovascular:  Negative for chest pain, palpitations and leg swelling.  Gastrointestinal:  Negative for blood in stool, constipation, diarrhea, melena, nausea and vomiting.  Genitourinary:  Negative for dysuria, frequency and urgency.  Musculoskeletal:  Negative for joint pain and myalgias.  Skin:  Negative for itching and rash.  Neurological:  Negative for weakness and headaches.  Psychiatric/Behavioral:  Positive for depression and suicidal ideas. The patient is nervous/anxious.   Patient denies SI, SH, or HI      Objective:     BP 116/83 (BP Location: Left Arm, Patient Position: Sitting, Cuff Size: Large)   Pulse 87  Temp 97.6 F (36.4 C) (Oral)   Ht 5' 6 (1.676 m)   Wt 245 lb 3.2 oz (111.2 kg)   SpO2 95%   BMI 39.58 kg/m  BP Readings from Last 3 Encounters:  01/12/24 116/83  07/11/23 125/79  02/26/23 130/63    Physical Exam Exam conducted with a chaperone present.  Constitutional:      Appearance: She is obese.  Neck:     Vascular: No carotid bruit.  Cardiovascular:     Rate and Rhythm: Normal rate and regular rhythm.     Pulses: Normal pulses.          Radial pulses are 2+ on the right side and 2+ on the left side.       Dorsalis pedis pulses are 2+ on the right side and 2+ on the left side.     Heart sounds: Normal heart sounds. No murmur heard.    No friction  rub. No gallop.  Pulmonary:     Effort: Pulmonary effort is normal.     Breath sounds: Normal breath sounds. No stridor. No wheezing, rhonchi or rales.  Abdominal:     General: Bowel sounds are normal.     Palpations: Abdomen is soft.     Tenderness: There is no abdominal tenderness. There is no guarding or rebound.  Genitourinary:    General: Normal vulva.     Vagina: No vaginal discharge.  Musculoskeletal:        General: No swelling.     Right lower leg: No edema.     Left lower leg: No edema.  Skin:    General: Skin is warm and dry.  Neurological:     Mental Status: She is alert.  Psychiatric:        Mood and Affect: Mood normal.        Behavior: Behavior normal.      Results for orders placed or performed in visit on 01/12/24  Glucose, capillary  Result Value Ref Range   Glucose-Capillary 163 (H) 70 - 99 mg/dL  POC Hbg J8R  Result Value Ref Range   Hemoglobin A1C 7.1 (A) 4.0 - 5.6 %   HbA1c POC (<> result, manual entry)     HbA1c, POC (prediabetic range)     HbA1c, POC (controlled diabetic range)     Last metabolic panel Lab Results  Component Value Date   GLUCOSE 120 (H) 02/26/2023   NA 138 02/26/2023   K 4.3 02/26/2023   CL 102 02/26/2023   CO2 22 02/26/2023   BUN 9 02/26/2023   CREATININE 0.53 (L) 02/26/2023   EGFR 119 02/26/2023   CALCIUM  9.0 02/26/2023   PROT 6.6 02/26/2023   ALBUMIN 3.9 02/26/2023   LABGLOB 2.7 02/26/2023   BILITOT 0.3 02/26/2023   ALKPHOS 59 02/26/2023   AST 18 02/26/2023   ALT 27 02/26/2023   ANIONGAP 9 10/13/2018   Last lipids Lab Results  Component Value Date   CHOL 144 07/11/2023   HDL 54 07/11/2023   LDLCALC 64 07/11/2023   TRIG 153 (H) 07/11/2023   CHOLHDL 2.7 07/11/2023   Last hemoglobin A1c Lab Results  Component Value Date   HGBA1C 7.1 (A) 01/12/2024   Last thyroid functions Lab Results  Component Value Date   TSH 1.350 10/23/2022        Assessment & Plan:    Routine Health Maintenance and Physical  Exam  Immunization History  Administered Date(s) Administered   Hepb-cpg 01/12/2024   Influenza, Seasonal, Injecte,  Preservative Fre 02/26/2023   PNEUMOCOCCAL CONJUGATE-20 01/12/2024   Tdap 05/21/2012, 07/25/2022   Unspecified SARS-COV-2 Vaccination 01/05/2024    Health Maintenance  Topic Date Due   HPV VACCINES (1 - 3-dose SCDM series) Never done   Cervical Cancer Screening (HPV/Pap Cotest)  Never done   Diabetic kidney evaluation - Urine ACR  10/23/2023   INFLUENZA VACCINE  01/09/2024   Hepatitis B Vaccines (2 of 2 - CpG 2-dose series) 02/09/2024   Diabetic kidney evaluation - eGFR measurement  02/26/2024   HEMOGLOBIN A1C  07/14/2024   OPHTHALMOLOGY EXAM  08/06/2024   FOOT EXAM  01/11/2025   DTaP/Tdap/Td (3 - Td or Tdap) 07/25/2032   Pneumococcal Vaccine: 19-49 Years  Completed   COVID-19 Vaccine  Completed   Hepatitis C Screening  Completed   HIV Screening  Completed   Meningococcal B Vaccine  Aged Out    Discussed health benefits of physical activity, and encouraged her to engage in regular exercise appropriate for her age and condition.  Assessment & Plan Type 2 diabetes mellitus without complication, unspecified whether long term insulin  use (HCC) A1C 07/11/2023 = 6.8 A1C 01/12/2024 = 7.1 Patient previously on Mounjaro  and Ozempic . She has not been able to afford these medications for about 1 month. When she was on Mounjaro , she was losing weight and feeling better. The ozempic  caused constipation and dehydration, but overall was working. She would like to be on Mounjaro  again, but the cost barrier is prohibitive. Patient reported diet overall mostly fast food and limited/no exercise. Weight gain of 25 pounds in 1 month.  -Messaged Medication Assistance to assess cost barrier to restart Mounjaro  -Diabetic Nutrition Counseling ordered  -spoke with patient about importance of eating well and exercising daily -follow up 3 mo for A1C -Diabetic Foot Exam 01/12/2024 -Patient saw  Ophthalmology a couple of months ago, reported WNL Hyperlipidemia associated with type 2 diabetes mellitus (HCC) Triglycerides 07/11/2023 = 153 Patient reported taking Rosuvastatin  10 mg  -Patient was fasting. Ordered Lipid Panel today, will assess if needed increase in Rosuvastatin  following resulting of labs Perimenopause Patient reported last menstrual period 2 weeks ago but experiencing hot flashes, vaginal dryness, fatigue, hair falling out, and weight gain (25 pounds in 1 month) -weight gain correlated with stopping Mounjaro  (cost prohibitive)  -repeat TSH, if WNL, consider further workup/management for perimenopause such as topical minoxidil  for hair loss and vaginal estrogen for vaginal dryness. If ineffective, escalate to PO options.  Pneumococcal vaccine administered Has received first dose of hepatitis B vaccine Received Pneumococcal and Hep B vaccines in office today Patient also reported up to date on Covid Vaccinations + boosters at this time -instructed patient to get influenza vaccine when available Cervical cancer screening Patient received Pap Smear in Clinic 01/12/2024 Overall Exam benign -will call/message patient with results of Pap Smear  Other fatigue Primary hypertension Snoring BP 116/63 Patient mentioned episodes of waking up suddenly from sleep gasping for air + snoring.  -Ambulatory Sleep study ordered  GAD (generalized anxiety disorder) Patient has history of GAD. Currently on Zoloft  25 mg, which patient said is working well. Patient last visit included increasing dosage to 50 mg, but patient had associated HA and said she felt overwhelmed. She felt like the higher dose wasn't doing more than the 25 mg and made her feel worse. She is content with current Zoloft  25 mg dosing. She denies any SI/HI/SH.  -continue Zoloft  25 mg   Return in about 3 months (around 04/13/2024).  Haevyn Ury, DO

## 2024-01-12 NOTE — Assessment & Plan Note (Signed)
 BP 116/63 Patient mentioned episodes of waking up suddenly from sleep gasping for air + snoring.  -Ambulatory Sleep study ordered

## 2024-01-12 NOTE — Patient Instructions (Addendum)
 Today we discussed the following medical conditions and plan:   Fatigue, Weight Gain, and Hair Loss -Referral to Sleep Medicine to evaluate for Sleep Apnea with your history of snoring and waking up the middle of the night gasping for air -checking TSH level (thyroid number) for Hypothyroidism  -I have reached out to our patient medication assistance advisor regarding cost of mounjaro   -we discussed the importance of diet and exercise today as well. Life can get in the way, but I know you can do it! Small steps are important steps, try cooking more at home and going on walks to begin.    -Other labs ordered included Lipid Panel -- I will call if I believe we should increase your cholesterol medication once that returns.   Pap Smear  -today we performed a pap smear and for screening of cervical cancer -I will call you with the results once they come in.  General Medical Care -you received the following vaccines today: Pneumococcal vaccine + Hepatitis B vaccine  -Please get your influenza vaccine in a couple of months  Diabetes -your A1C elevated to 7.1 from 6.8 -You underwent your diabetic foot exam  -I placed a referral to Mrs. Arland, our diabetes coordinator, to discuss nutrition goals. You should hear back later regarding that appointment   We look forward to seeing you next time. Please call our clinic at [phone] if you have any questions or concerns. The best time to call is Monday-Friday from 9am-4pm, but there is someone available 24/7. If you need medication refills, please notify your pharmacy one week in advance and they will send us  a request.   Thank you for trusting me with your care. Wishing you the best!   Sallyanne Primas, DO  [facility]

## 2024-01-12 NOTE — Assessment & Plan Note (Signed)
 Received Pneumococcal and Hep B vaccines in office today Patient also reported up to date on Covid Vaccinations + boosters at this time -instructed patient to get influenza vaccine when available

## 2024-01-12 NOTE — Assessment & Plan Note (Addendum)
 Triglycerides 07/11/2023 = 153 Patient reported taking Rosuvastatin  10 mg  -Patient was fasting. Ordered Lipid Panel today, will assess if needed increase in Rosuvastatin  following resulting of labs

## 2024-01-12 NOTE — Assessment & Plan Note (Deleted)
 Due for:  Pneumococcal Vaccine -- says she would  A1C  Diabetic Foot exam:  Influenza -- interested  Hep B - Surface Ab last documented Negative. Interested in being immuized  Cervical Cancer Screening  Covid 19-- got 2 covid shots and 2 boosteres

## 2024-01-12 NOTE — Assessment & Plan Note (Addendum)
 BP 116/63 Patient mentioned episodes of waking up suddenly from sleep gasping for air + snoring.  -Ambulatory Sleep study ordered

## 2024-01-12 NOTE — Assessment & Plan Note (Addendum)
 Patient has history of GAD. Currently on Zoloft  25 mg, which patient said is working well. Patient last visit included increasing dosage to 50 mg, but patient had associated HA and said she felt overwhelmed. She felt like the higher dose wasn't doing more than the 25 mg and made her feel worse. She is content with current Zoloft  25 mg dosing. She denies any SI/HI/SH.  -continue Zoloft  25 mg

## 2024-01-13 ENCOUNTER — Telehealth: Payer: Self-pay

## 2024-01-13 ENCOUNTER — Other Ambulatory Visit: Payer: Self-pay

## 2024-01-13 LAB — LIPID PANEL
Chol/HDL Ratio: 2.8 ratio (ref 0.0–4.4)
Cholesterol, Total: 146 mg/dL (ref 100–199)
HDL: 53 mg/dL (ref >39)
LDL Chol Calc (NIH): 66 mg/dL (ref 0–99)
Triglycerides: 158 mg/dL — ABNORMAL HIGH (ref 0–149)
VLDL Cholesterol Cal: 27 mg/dL (ref 5–40)

## 2024-01-13 LAB — TSH: TSH: 1.76 u[IU]/mL (ref 0.450–4.500)

## 2024-01-13 MED ORDER — MINOXIDIL 5 % EX FOAM: 0.5000 | Freq: Two times a day (BID) | CUTANEOUS | 0 refills | Status: AC

## 2024-01-13 MED ORDER — ESTRADIOL 0.1 MG/GM VA CREA
1.0000 | TOPICAL_CREAM | Freq: Every day | VAGINAL | 12 refills | Status: AC
Start: 2024-01-13 — End: ?

## 2024-01-13 MED ORDER — OMEGA-3-ACID ETHYL ESTERS 1 G PO CAPS: 2.0000 g | ORAL_CAPSULE | Freq: Two times a day (BID) | ORAL | 11 refills | Status: AC

## 2024-01-13 NOTE — Telephone Encounter (Signed)
 Called patient on 01/13/2024 at 2:30 PM.  Discussed with patient results of lipid panel.  Triglycerides elevated and stated will start on omega-3 fatty acid.    Discussed with patient normal TSH return.  I told the patient it was unlikely she has hypothyroidism at this time. I informed her that I will call in topical minoxidil  and vaginal estrogen.  Patient instructed to continue with follow-up in 3 months and to call with any concerns in the interim.   Discussed with patient that Mounjaro  would unfortunately not be covered with her insurance.  Patient voiced understanding.  Patient agreeable to all the above documented.  Sallyanne Primas, DO  [facility]

## 2024-01-14 NOTE — Progress Notes (Signed)
 Internal Medicine Clinic Attending  I was physically present during the key portions of the resident provided service and participated in the medical decision making of patient's management care. I reviewed pertinent patient test results.  The assessment, diagnosis, and plan were formulated together and I agree with the documentation in the resident's note.  Lovie Clarity, MD    Pap done today, results still pending

## 2024-01-15 ENCOUNTER — Telehealth: Payer: Self-pay | Admitting: *Deleted

## 2024-01-15 ENCOUNTER — Ambulatory Visit: Payer: Self-pay

## 2024-01-15 LAB — CYTOLOGY - PAP
Adequacy: ABSENT
Comment: NEGATIVE
Diagnosis: NEGATIVE
High risk HPV: NEGATIVE

## 2024-01-15 NOTE — Telephone Encounter (Signed)
 Call to patient-message was left that her forms are ready for [facility] and to call the Clinics.  Patient was also informed that she will need [facility] to [facility] her Mammogram.

## 2024-01-21 ENCOUNTER — Other Ambulatory Visit: Payer: Self-pay | Admitting: Student

## 2024-01-21 DIAGNOSIS — F411 Generalized anxiety disorder: Secondary | ICD-10-CM

## 2024-01-28 ENCOUNTER — Ambulatory Visit

## 2024-02-02 ENCOUNTER — Encounter: Admitting: Dietician

## 2024-02-13 ENCOUNTER — Telehealth: Admitting: Physician Assistant

## 2024-02-13 DIAGNOSIS — B001 Herpesviral vesicular dermatitis: Secondary | ICD-10-CM

## 2024-02-13 MED ORDER — VALACYCLOVIR HCL 1 G PO TABS: 2000.0000 mg | ORAL_TABLET | Freq: Two times a day (BID) | ORAL | 0 refills | Status: AC

## 2024-02-13 NOTE — Progress Notes (Signed)

## 2024-02-16 ENCOUNTER — Ambulatory Visit: Payer: Self-pay

## 2024-02-16 NOTE — Telephone Encounter (Signed)
 I talked to pt who stated she had fever, h/a, sore throat; thinks she had an virus in which she is slowly getting over. But she still has the cold sores/blisters around her nose, lip, chin and Valtrex  which was prescribed makes her light-headed,tired. An appt is scheduled tomorrow am 02/17/24 with Dr Elnora.

## 2024-02-16 NOTE — Telephone Encounter (Signed)
  FYI Only or Action Required?: FYI only for provider.  Patient was last seen in primary care on 01/12/2024 by Benuel Braun, DO.  Called Nurse Triage reporting Fatigue, Diarrhea, Nausea, and Nasal Congestion.  Symptoms began a week ago.  Interventions attempted: Other: patient had virtual visit for cold sores, but did not mention her other symptoms.  Symptoms are: unchanged.  Triage Disposition: See Physician Within 24 Hours  Patient/caregiver understands and will follow disposition?: Yes  Message from [facility] sent at 02/16/2024 11:15 AM EDT  Summary: medication   Patient was prescribed the medication Valacyclovir  but the medication is making her more fatigued, dried out, light headed. Patient is wanting to know if something else could be prescribed.         Reason for Disposition  [1] MODERATE diarrhea (e.g., 4-6 times / day more than normal) AND [2] present > 48 hours (2 days)  Answer Assessment - Initial Assessment Questions Patient on valcyclovir for cold sores, but also has cold symptoms and diarrhea  1. DIARRHEA SEVERITY: How bad is the diarrhea? How many more stools have you had in the past 24 hours than normal?      4 diarrhea BM's this morning 2. ONSET: When did the diarrhea begin?      Four days ago 3. STOOL DESCRIPTION:  How loose or watery is the diarrhea? What is the stool color? Is there any blood or mucous in the stool?     watery 4. VOMITING: Are you also vomiting? If Yes, ask: How many times in the past 24 hours?      Denies, but feels nauseated 5. ABDOMEN PAIN: Are you having any abdomen pain? If Yes, ask: What does it feel like? (e.g., crampy, dull, intermittent, constant)      denies 6. ABDOMEN PAIN SEVERITY: If present, ask: How bad is the pain?  (e.g., Scale 1-10; mild, moderate, or severe)     N/A 7. ORAL INTAKE: If vomiting, Have you been able to drink liquids? How much liquids have you had in the past 24 hours?     N/A 8.  HYDRATION: Any signs of dehydration? (e.g., dry mouth [not just dry lips], too weak to stand, dizziness, new weight loss) When did you last urinate?     Feeling weak, but drinking fluids, specifically hydration packet 9. EXPOSURE: Have you traveled to a foreign country recently? Have you been exposed to anyone with diarrhea? Could you have eaten any food that was spoiled?     No travel, children were recently sick 10. ANTIBIOTIC USE: Are you taking antibiotics now or have you taken antibiotics in the past 2 months?       denies 11. OTHER SYMPTOMS: Do you have any other symptoms? (e.g., fever, blood in stool)       Nasal congestion, weakness, cold sores (resolving) 12. PREGNANCY: Is there any chance you are pregnant? When was your last menstrual period?       N/A  Protocols used: [facility]

## 2024-02-17 ENCOUNTER — Encounter: Payer: Self-pay | Admitting: Student

## 2024-02-17 ENCOUNTER — Ambulatory Visit (INDEPENDENT_AMBULATORY_CARE_PROVIDER_SITE_OTHER): Payer: Self-pay | Admitting: Student

## 2024-02-17 VITALS — BP 134/79 | HR 91 | Temp 97.7°F | Ht 66.0 in | Wt 238.0 lb

## 2024-02-17 DIAGNOSIS — B349 Viral infection, unspecified: Secondary | ICD-10-CM

## 2024-02-17 DIAGNOSIS — E1169 Type 2 diabetes mellitus with other specified complication: Secondary | ICD-10-CM | POA: Diagnosis not present

## 2024-02-17 NOTE — Progress Notes (Signed)
 Subjective:  CC: viral infection  HPI:  [name] is a 43 y.o. female with a past medical history stated below and presents today for viral infection. Please see problem based assessment and plan for additional details.  Past Medical History:  Diagnosis Date   Gestational diabetes    insulin     Current Outpatient Medications on File Prior to Visit  Medication Sig Dispense Refill   benzonatate  (TESSALON ) 100 MG capsule Take 1-2 caps PO TID PRN 20 capsule 0   Blood Pressure Monitoring (BLOOD PRESSURE CUFF) MISC 1 Units by Does not apply route once for 1 dose. Check blood pressure daily 1 each 0   estradiol  (ESTRACE  VAGINAL) 0.1 MG/GM vaginal cream Place 1 Applicatorful vaginally at bedtime. 42.5 g 12   fluticasone  (FLONASE ) 50 MCG/ACT nasal spray Place 2 sprays into both nostrils daily. 16 g 0   glucose blood (FREESTYLE LITE) test strip Check blood sugar up to once daily 100 each 3   Lancets 33G MISC Check blood sugar up to once daily 100 each 3   Minoxidil  5 % FOAM Apply 0.5 Capfuls topically 2 (two) times daily. 60 g 0   omega-3 acid ethyl esters (LOVAZA ) 1 g capsule Take 2 capsules (2 g total) by mouth 2 (two) times daily. 120 capsule 11   rosuvastatin  (CRESTOR ) 10 MG tablet Take 1 tablet (10 mg total) by mouth daily. 30 tablet 11   sertraline  (ZOLOFT ) 25 MG tablet Take 1 tablet (25 mg total) by mouth daily. 30 tablet 3   tirzepatide  (MOUNJARO ) 2.5 MG/0.5ML Pen Inject 2.5 mg into the skin once a week. 2 mL 3   [DISCONTINUED] albuterol  (PROVENTIL  HFA;VENTOLIN  HFA) 108 (90 BASE) MCG/ACT inhaler Inhale 1-2 puffs into the lungs every 6 (six) hours as needed for wheezing or shortness of breath. 1 Inhaler 1   No current facility-administered medications on file prior to visit.    Family History  Problem Relation Age of Onset   Diabetes Mother    Hypertension Mother    Breast cancer Neg Hx     Social History   Socioeconomic History   Marital status: Married    Spouse  name: Not on file   Number of children: Not on file   Years of education: Not on file   Highest education level: GED or equivalent  Occupational History   Not on file  Tobacco Use   Smoking status: Never   Smokeless tobacco: Never  Vaping Use   Vaping status: Never Used  Substance and Sexual Activity   Alcohol use: Not Currently    Comment: rare   Drug use: No   Sexual activity: Yes    Birth control/protection: None  Other Topics Concern   Not on file  Social History Narrative   Not on file   Social Drivers of Health   Financial Resource Strain: Medium Risk (02/16/2024)   Overall Financial Resource Strain (CARDIA)    Difficulty of Paying Living Expenses: Somewhat hard  Food Insecurity: Food Insecurity Present (02/16/2024)   Hunger Vital Sign    Worried About Running Out of Food in the Last Year: Often true    Ran Out of Food in the Last Year: Sometimes true  Transportation Needs: No Transportation Needs (02/16/2024)   PRAPARE - Administrator, Civil Service (Medical): No    Lack of Transportation (Non-Medical): No  Physical Activity: Inactive (02/16/2024)   Exercise Vital Sign    Days of Exercise per Week: 1 day  Minutes of Exercise per Session: 0 min  Stress: Stress Concern Present (02/16/2024)   [employer] of Occupational Health - Occupational Stress Questionnaire    Feeling of Stress: Rather much  Social Connections: Moderately Isolated (02/16/2024)   Social Connection and Isolation Panel    Frequency of Communication with Friends and Family: More than three times a week    Frequency of Social Gatherings with Friends and Family: Never    Attends Religious Services: Never    Database administrator or Organizations: No    Attends Engineer, structural: Not on file    Marital Status: Married  Catering manager Violence: Not At Risk (01/12/2024)   Humiliation, Afraid, Rape, and Kick questionnaire    Fear of Current or Ex-Partner: No    Emotionally  Abused: No    Physically Abused: No    Sexually Abused: No    Review of Systems: ROS negative except for what is noted on the assessment and plan.  Objective:   Vitals:   02/17/24 0815  BP: 134/79  Pulse: 91  Temp: 97.7 F (36.5 C)  TempSrc: Oral  SpO2: 98%  Weight: 238 lb (108 kg)  Height: 5' 6 (1.676 m)    Physical Exam: Constitutional: well-appearing woman sitting in exam bed, in no acute distress HENT: normocephalic atraumatic, mucous membranes moist, mild post nasal drip. Non erythematous posterior oropharynx Eyes: conjunctiva non-erythematous Neck: supple Cardiovascular: regular rate and rhythm, no m/r/g Pulmonary/Chest: normal work of breathing on room air, lungs clear to auscultation bilaterally Abdominal: soft, non-tender, non-distended MSK: normal bulk and tone Neurological: alert & oriented x 3, 5/5 strength in bilateral upper and lower extremities, normal gait Skin: warm and dry, normal skin turgor Psych: Sad mood and affect       01/12/2024   11:14 AM  Depression screen PHQ 2/9  Decreased Interest 0  Down, Depressed, Hopeless 0  PHQ - 2 Score 0     Assessment & Plan:   Acute viral syndrome Patient reports rhinorrhea, lacrimation, sore throat, and general malaise since this started around Wednesday of last week.  She reports high fevers at home.  She also had some  She is here to be admitted for marching and right nasal middle ear that initially was treated with positive tricks called to be packed.  I called for this.  This continues to improve without significant shortness of breath by Friday of last week.  However, since Sunday night she started experiencing diarrhea with some foods.  She is able to taking fluids and soft foods but anything containing fiber or cheeses or fats she is not able to contain.  Her daughter had similar presentation last week but recovered a lot faster.  Her most bothersome symptoms today are the general fatigue, malaise, and  intermittent diarrhea with solid consumption.  Denies nausea or vomiting at this time.  Vitals:   02/17/24 0815  BP: 134/79  Pulse: 91  Temp: 97.7 F (36.5 C)  SpO2: 98%     On examination today, she has no gross acute abnormalities.  Discussed supportive treatment and slowly uptitrating her fluid consumption.  Discussed precautionary symptoms to seek medical attention again if she does not have Mounjaro  at home; will send a message to Mail about starting assistance for these.  However, discussed that these medications should not start while she is recovering from this viral illness  Type II diabetes mellitus (HCC) Patient has not received medication assistance application for Mounjaro .  Will communicate with  complaint about this    Return in about 3 months (around 05/18/2024) for Chronic condition follow-up.  Patient discussed with Dr. Lovie Hadassah Kristy Rosario, MD [facility]  02/17/2024, 8:56 AM

## 2024-02-17 NOTE — Progress Notes (Signed)
 Internal Medicine Clinic Attending  Case discussed with the resident at the time of the visit.  We reviewed the resident's history and exam and pertinent patient test results.  I agree with the assessment, diagnosis, and plan of care documented in the resident's note.

## 2024-02-17 NOTE — Assessment & Plan Note (Signed)
 Patient has not received medication assistance application for Mounjaro .  Will communicate with complaint about this

## 2024-02-17 NOTE — Assessment & Plan Note (Signed)
 Patient reports rhinorrhea, lacrimation, sore throat, and general malaise since this started around Wednesday of last week.  She reports high fevers at home.  She also had some  She is here to be admitted for marching and right nasal middle ear that initially was treated with positive tricks called to be packed.  I called for this.  This continues to improve without significant shortness of breath by Friday of last week.  However, since Sunday night she started experiencing diarrhea with some foods.  She is able to taking fluids and soft foods but anything containing fiber or cheeses or fats she is not able to contain.  Her daughter had similar presentation last week but recovered a lot faster.  Her most bothersome symptoms today are the general fatigue, malaise, and intermittent diarrhea with solid consumption.  Denies nausea or vomiting at this time.  Vitals:   02/17/24 0815  BP: 134/79  Pulse: 91  Temp: 97.7 F (36.5 C)  SpO2: 98%     On examination today, she has no gross acute abnormalities.  Discussed supportive treatment and slowly uptitrating her fluid consumption.  Discussed precautionary symptoms to seek medical attention again if she does not have Mounjaro  at home; will send a message to Mail about starting assistance for these.  However, discussed that these medications should not start while she is recovering from this viral illness

## 2024-02-17 NOTE — Patient Instructions (Addendum)
 Thank you, Ms.Carmon Stank for allowing us  to provide your care today. Today we discussed   Your acute viral syndrome.  It is important that you increase your fluid intake slowly, prioritizing simple starches, broths and syrups, electrolytes and hydration  I will discuss the medication assistance with our pharmacy technician.  But this is not a time to start this medication.  I want you to feel back to yourself to 100% prior to starting any weight loss/diabetes medications  We discussed what symptoms would prompt her to come back: Not able to keep any fluids down, fever, worsening breathing or respiratory symptoms  I will add the work note for you.  Please schedule a follow-up in 3 months for your regular chronic condition follow-up   We look forward to seeing you next time. Please call our clinic at [phone] if you have any questions or concerns. The best time to call is Monday-Friday from 9am-4pm, but there is someone available 24/7. If after hours or the weekend, call the main hospital number and ask for the Internal Medicine Resident On-Call. If you need medication refills, please notify your pharmacy one week in advance and they will send us  a request.   Thank you for letting us  take part in your care. Wishing you the best!  Elnora Ip, MD 02/17/2024, 8:39 AM [facility]

## 2024-02-18 ENCOUNTER — Other Ambulatory Visit (HOSPITAL_COMMUNITY): Payer: Self-pay

## 2024-02-21 ENCOUNTER — Telehealth: Admitting: Nurse Practitioner

## 2024-02-21 DIAGNOSIS — K089 Disorder of teeth and supporting structures, unspecified: Secondary | ICD-10-CM

## 2024-02-21 MED ORDER — IBUPROFEN 600 MG PO TABS: 600.0000 mg | ORAL_TABLET | Freq: Three times a day (TID) | ORAL | 0 refills | Status: DC | PRN

## 2024-02-21 MED ORDER — CHLORHEXIDINE GLUCONATE 0.12 % MT SOLN: 15.0000 mL | Freq: Two times a day (BID) | OROMUCOSAL | 0 refills | Status: AC

## 2024-02-21 NOTE — Progress Notes (Signed)
 E-Visit for Dental Pain  We are sorry that you are not feeling well.  Here is how we plan to help!  Based on the photo there does not appear to be an abscess. I am sending an prescription strength bactericidal mouth rinse and prescription strength motrin  to th pharmacy. At this time by mouth antibiotics would not be warranted.   Ibuprofen  600mg  3 times a day for 7 days for discomfort  It is imperative that you see a dentist within 10 days of this eVisit to determine the cause of the dental pain and be sure it is adequately treated  A toothache or tooth pain is caused when the nerve in the root of a tooth or surrounding a tooth is irritated. Dental (tooth) infection, decay, injury, or loss of a tooth are the most common causes of dental pain. Pain may also occur after an extraction (tooth is pulled out). Pain sometimes originates from other areas and radiates to the jaw, thus appearing to be tooth pain.Bacteria growing inside your mouth can contribute to gum disease and dental decay, both of which can cause pain. A toothache occurs from inflammation of the central portion of the tooth called pulp. The pulp contains nerve endings that are very sensitive to pain. Inflammation to the pulp or pulpitis may be caused by dental cavities, trauma, and infection.    HOME CARE:   For toothaches: Over-the-counter pain medications such as acetaminophen  or ibuprofen  may be used. Take these as directed on the package while you arrange for a dental appointment. Avoid very cold or hot foods, because they may make the pain worse. You may get relief from biting on a cotton ball soaked in oil of cloves. You can get oil of cloves at most drug stores.  For jaw pain:  Aspirin may be helpful for problems in the joint of the jaw in adults. If pain happens every time you open your mouth widely, the temporomandibular joint (TMJ) may be the source of the pain. Yawning or taking a large bite of food may worsen the pain. An  appointment with your doctor or dentist will help you find the cause.     GET HELP RIGHT AWAY IF:  You have a high fever or chills If you have had a recent head or face injury and develop headache, light headedness, nausea, vomiting, or other symptoms that concern you after an injury to your face or mouth, you could have a more serious injury in addition to your dental injury. A facial rash associated with a toothache: This condition may improve with medication. Contact your doctor for them to decide what is appropriate. Any jaw pain occurring with chest pain: Although jaw pain is most commonly caused by dental disease, it is sometimes referred pain from other areas. People with heart disease, especially people who have had stents placed, people with diabetes, or those who have had heart surgery may have jaw pain as a symptom of heart attack or angina. If your jaw or tooth pain is associated with lightheadedness, sweating, or shortness of breath, you should see a doctor as soon as possible. Trouble swallowing or excessive pain or bleeding from gums: If you have a history of a weakened immune system, diabetes, or steroid use, you may be more susceptible to infections. Infections can often be more severe and extensive or caused by unusual organisms. Dental and gum infections in people with these conditions may require more aggressive treatment. An abscess may need draining or IV antibiotics,  for example.  MAKE SURE YOU   Understand these instructions. Will watch your condition. Will get help right away if you are not doing well or get worse.  Thank you for choosing an e-visit.  Your e-visit answers were reviewed by a board certified advanced clinical practitioner to complete your personal care plan. Depending upon the condition, your plan could have included both over the counter or prescription medications.  Please review your pharmacy choice. Make sure the pharmacy is open so you can pick up  prescription now. If there is a problem, you may contact your provider through [employer] and have the prescription routed to another pharmacy.  Your safety is important to us . If you have drug allergies check your prescription carefully.   For the next 24 hours you can use MyChart to ask questions about today's visit, request a non-urgent call back, or ask for a work or school excuse. You will get an email in the next two days asking about your experience. I hope that your e-visit has been valuable and will speed your recovery.

## 2024-02-22 ENCOUNTER — Other Ambulatory Visit: Payer: Self-pay | Admitting: Student

## 2024-02-22 DIAGNOSIS — F411 Generalized anxiety disorder: Secondary | ICD-10-CM

## 2024-02-23 ENCOUNTER — Telehealth: Payer: Self-pay | Admitting: *Deleted

## 2024-02-23 ENCOUNTER — Other Ambulatory Visit (HOSPITAL_COMMUNITY): Payer: Self-pay

## 2024-02-23 ENCOUNTER — Encounter: Payer: Self-pay | Admitting: Student

## 2024-02-23 ENCOUNTER — Other Ambulatory Visit: Payer: Self-pay | Admitting: Student

## 2024-02-23 ENCOUNTER — Telehealth: Payer: Self-pay

## 2024-02-23 DIAGNOSIS — F411 Generalized anxiety disorder: Secondary | ICD-10-CM

## 2024-02-23 MED ORDER — SERTRALINE HCL 25 MG PO TABS: 25.0000 mg | ORAL_TABLET | Freq: Every day | ORAL | 3 refills | Status: DC

## 2024-02-23 MED ORDER — SEMAGLUTIDE(0.25 OR 0.5MG/DOS) 2 MG/3ML ~~LOC~~ SOPN: 0.2500 mg | PEN_INJECTOR | SUBCUTANEOUS | 3 refills | Status: AC

## 2024-02-23 NOTE — Addendum Note (Signed)
 Addended by: KRISTY ROSARIO IP on: 02/23/2024 09:55 AM   Modules accepted: Orders

## 2024-02-23 NOTE — Telephone Encounter (Signed)
 Copied from CRM [id number]. Topic: Clinical - Prescription Issue >> Feb 23, 2024  3:47 PM Diannia H wrote: Reason for CRM: Patient is wanting her Sertraline  HCl (25 mg Oral Daily) to go to [id number] - [place], [place] - [address] [place] [address] Phone: [phone] Fax: [phone] Hours: Not open 24 hours

## 2024-02-23 NOTE — Telephone Encounter (Signed)
 Copied from CRM [id number]. Topic: Clinical - Medical Advice >> Feb 23, 2024 12:52 PM Carrielelia G wrote: Patient Megan Barry called she is requesting a work excuse note for when she was ill.  February 09, 2024 to February 13, 2024.  Employer is requesting this or they will terminate her employment. She is needing this note as soon as possible.   Can this note be sent through Mychart?   Please advise.

## 2024-02-23 NOTE — Telephone Encounter (Signed)
 Copied from CRM #[id number]. Topic: Clinical - Medication Refill >> Feb 23, 2024  9:42 AM Suzette B wrote: Medication: sertraline  (ZOLOFT ) 25 MG tablet  Has the patient contacted their pharmacy? Yes They stated that had no refills for this medication  [id number] [place], [place] - [address] [place] [address] Phone: [phone] Fax: [phone]    Is this the correct pharmacy for this prescription? Yes If no, delete pharmacy and type the correct one.   Has the prescription been filled recently? Yes  Is the patient out of the medication? Yes  Has the patient been seen for an appointment in the last year OR does the patient have an upcoming appointment? Yes  Can we respond through MyChart? Yes  Agent: Please be advised that Rx refills may take up to 3 business days. We ask that you follow-up with your pharmacy.

## 2024-02-23 NOTE — Progress Notes (Addendum)
 Per Rosina Lavern SAILOR, CPhT :  Patient has commercial insurance and Mounjaro 's copay is $1000+ (that's with insurance- she has $1000 more on her deductible to meet). The savings card they offer will only cover up to $150 of patients out of pocket.   As such, will try Ozempic  to see if her insurance approves it. If the co-pay if too high, this would be unfortunate, as there is no other medication assistance available for insured patient.

## 2024-02-23 NOTE — Telephone Encounter (Signed)
 Prior authorization submitted for OZEMPIC  0.25.0.5MG   via RXBENEFITS/ PROMPTPA.   Key: [id number]

## 2024-02-23 NOTE — Telephone Encounter (Signed)
 I can supply a note about 02/13/24 only as that is the only day we treated her through the Virtual Urgent Care department.   Per chart review patient does not appear to have been seen on the dates she is requesting.   Will allow the PCP to determine if other notes are appropriate.   Fortunato Dickinson, PA-C [facility]

## 2024-02-23 NOTE — Telephone Encounter (Signed)
 RTC to patient.  Message left that the clinics had returned her call.  Will forward to PCP and Megan Barry  Copied from CRM [id number]. Topic: Clinical - Medical Advice >> Feb 23, 2024 12:52 PM Megan Barry wrote: Patient Megan Barry called she is requesting a work excuse note for when she was ill.  February 09, 2024 to February 13, 2024.  Employer is requesting this or they will terminate her employment. She is needing this note as soon as possible.   Can this note be sent through Mychart?   Please advise.

## 2024-02-24 NOTE — Telephone Encounter (Signed)
 Call to patient to see if she received the Work Letter through [employer] if not can come to office to pick up a copy.  Message left that letter is available for pick up in the office if she did not receive it through MyChart.

## 2024-02-25 NOTE — Telephone Encounter (Signed)
 She was seen by Dr. Elnora on September 9 for an acute viral illness that began the week prior and lasted the entire week. I'll write a note excusing her from work from 9/1 to 9/5.

## 2024-02-28 ENCOUNTER — Telehealth: Admitting: Nurse Practitioner

## 2024-02-28 DIAGNOSIS — L819 Disorder of pigmentation, unspecified: Secondary | ICD-10-CM

## 2024-02-28 NOTE — Progress Notes (Signed)
 Virtual Visit Consent   Megan Barry, you are scheduled for a virtual visit with a [facility] provider today. Just as with appointments in the office, your consent must be obtained to participate. Your consent will be active for this visit and any virtual visit you may have with one of our providers in the next 365 days. If you have a MyChart account, a copy of this consent can be sent to you electronically.  As this is a virtual visit, video technology does not allow for your provider to perform a traditional examination. This may limit your provider's ability to fully assess your condition. If your provider identifies any concerns that need to be evaluated in person or the need to arrange testing (such as labs, EKG, etc.), we will make arrangements to do so. Although advances in technology are sophisticated, we cannot ensure that it will always work on either your end or our end. If the connection with a video visit is poor, the visit may have to be switched to a telephone visit. With either a video or telephone visit, we are not always able to ensure that we have a secure connection.  By engaging in this virtual visit, you consent to the provision of healthcare and authorize for your insurance to be billed (if applicable) for the services provided during this visit. Depending on your insurance coverage, you may receive a charge related to this service.  I need to obtain your verbal consent now. Are you willing to proceed with your visit today? Megan Barry has provided verbal consent on 02/28/2024 for a virtual visit (video or telephone). Megan LELON Servant, NP  Date: 02/28/2024 9:36 AM   Virtual Visit via Video Note   I, Megan Barry, connected with  Megan Barry  ([id number], 1981-02-05) on 02/28/24 at  9:30 AM EDT by a video-enabled telemedicine application and verified that I am speaking with the correct person using two identifiers.  Location: Patient: Virtual Visit Location Patient:  Home Provider: Virtual Visit Location Provider: Home Office   I discussed the limitations of evaluation and management by telemedicine and the availability of in person appointments. The patient expressed understanding and agreed to proceed.    History of Present Illness: Megan Barry is a 43 y.o. who identifies as a female who was assigned female at birth, and is being seen today for skin change.   Megan Barry states over the past few days she has noticed redness under both feet. The redness is not associated with any itching, coldness of feet or sores/lesions on the feet.    Problems:  Patient Active Problem List   Diagnosis Date Noted   Acute viral syndrome 02/17/2024   Has received first dose of hepatitis B vaccine 01/12/2024   Perimenopause 01/12/2024   Snoring 01/12/2024   Morbid obesity (HCC) 02/26/2023   Hyperlipidemia associated with type 2 diabetes mellitus (HCC) 02/26/2023   Fatigue 10/24/2022   GAD (generalized anxiety disorder) 10/24/2022   Positive QuantiFERON-TB Gold test 10/24/2022   Health care maintenance 07/25/2022   Type II diabetes mellitus (HCC) 07/24/2021   Hypertension 07/24/2021    Allergies:  Allergies  Allergen Reactions   Other Hives and Rash    Egg Plant   Medications:  Current Outpatient Medications:    benzonatate  (TESSALON ) 100 MG capsule, Take 1-2 caps PO TID PRN, Disp: 20 capsule, Rfl: 0   Blood Pressure Monitoring (BLOOD PRESSURE CUFF) MISC, 1 Units by Does not apply route once for 1 dose. Check blood  pressure daily, Disp: 1 each, Rfl: 0   chlorhexidine  (PERIDEX ) 0.12 % solution, Use as directed 15 mLs in the mouth or throat 2 (two) times daily., Disp: 900 mL, Rfl: 0   estradiol  (ESTRACE  VAGINAL) 0.1 MG/GM vaginal cream, Place 1 Applicatorful vaginally at bedtime., Disp: 42.5 g, Rfl: 12   fluticasone  (FLONASE ) 50 MCG/ACT nasal spray, Place 2 sprays into both nostrils daily., Disp: 16 g, Rfl: 0   glucose blood (FREESTYLE LITE) test strip, Check  blood sugar up to once daily, Disp: 100 each, Rfl: 3   Lancets 33G MISC, Check blood sugar up to once daily, Disp: 100 each, Rfl: 3   Minoxidil  5 % FOAM, Apply 0.5 Capfuls topically 2 (two) times daily., Disp: 60 g, Rfl: 0   omega-3 acid ethyl esters (LOVAZA ) 1 g capsule, Take 2 capsules (2 g total) by mouth 2 (two) times daily., Disp: 120 capsule, Rfl: 11   rosuvastatin  (CRESTOR ) 10 MG tablet, Take 1 tablet (10 mg total) by mouth daily., Disp: 30 tablet, Rfl: 11   Semaglutide ,0.25 or 0.5MG /DOS, 2 MG/3ML SOPN, Inject 0.25 mg into the skin once a week. Skip dose if persistent nausea or vomiting and call your doctor, Disp: 3 mL, Rfl: 3   sertraline  (ZOLOFT ) 25 MG tablet, Take 1 tablet (25 mg total) by mouth daily., Disp: 30 tablet, Rfl: 3  Observations/Objective: Patient is well-developed, well-nourished in no acute distress.  Resting comfortably at home.  Head is normocephalic, atraumatic.  No labored breathing.  Speech is clear and coherent with logical content.  Patient is alert and oriented at baseline.    Assessment and Plan: 1. Discoloration of skin of foot (Primary) Wear cushioned socks when walking on the floor. No barefeet.   Follow Up Instructions: I discussed the assessment and treatment plan with the patient. The patient was provided an opportunity to ask questions and all were answered. The patient agreed with the plan and demonstrated an understanding of the instructions.  A copy of instructions were sent to the patient via MyChart unless otherwise noted below.    The patient was advised to call back or seek an in-person evaluation if the symptoms worsen or if the condition fails to improve as anticipated.    Ajai Harville W Thayne Cindric, NP

## 2024-02-28 NOTE — Patient Instructions (Signed)
  Megan Barry, thank you for joining [name], NP for today's virtual visit.  While this provider is not your primary care provider (PCP), if your PCP is located in our provider database this encounter information will be shared with them immediately following your visit.   A [facility] MyChart account gives you access to today's visit and all your visits, tests, and labs performed at [facility]  click here if you don't have a [facility] MyChart account or go to [email or web address]  Consent: (Patient) Megan Barry provided verbal consent for this virtual visit at the beginning of the encounter.  Current Medications:  Current Outpatient Medications:    benzonatate  (TESSALON ) 100 MG capsule, Take 1-2 caps PO TID PRN, Disp: 20 capsule, Rfl: 0   Blood Pressure Monitoring (BLOOD PRESSURE CUFF) MISC, 1 Units by Does not apply route once for 1 dose. Check blood pressure daily, Disp: 1 each, Rfl: 0   chlorhexidine  (PERIDEX ) 0.12 % solution, Use as directed 15 mLs in the mouth or throat 2 (two) times daily., Disp: 900 mL, Rfl: 0   estradiol  (ESTRACE  VAGINAL) 0.1 MG/GM vaginal cream, Place 1 Applicatorful vaginally at bedtime., Disp: 42.5 g, Rfl: 12   fluticasone  (FLONASE ) 50 MCG/ACT nasal spray, Place 2 sprays into both nostrils daily., Disp: 16 g, Rfl: 0   glucose blood (FREESTYLE LITE) test strip, Check blood sugar up to once daily, Disp: 100 each, Rfl: 3   Lancets 33G MISC, Check blood sugar up to once daily, Disp: 100 each, Rfl: 3   Minoxidil  5 % FOAM, Apply 0.5 Capfuls topically 2 (two) times daily., Disp: 60 g, Rfl: 0   omega-3 acid ethyl esters (LOVAZA ) 1 g capsule, Take 2 capsules (2 g total) by mouth 2 (two) times daily., Disp: 120 capsule, Rfl: 11   rosuvastatin  (CRESTOR ) 10 MG tablet, Take 1 tablet (10 mg total) by mouth daily., Disp: 30 tablet, Rfl: 11   Semaglutide ,0.25 or 0.5MG /DOS, 2 MG/3ML SOPN, Inject 0.25 mg into the skin once a week. Skip dose if persistent  nausea or vomiting and call your doctor, Disp: 3 mL, Rfl: 3   sertraline  (ZOLOFT ) 25 MG tablet, Take 1 tablet (25 mg total) by mouth daily., Disp: 30 tablet, Rfl: 3   Medications ordered in this encounter:  No orders of the defined types were placed in this encounter.    *If you need refills on other medications prior to your next appointment, please contact your pharmacy*  Follow-Up: Call back or seek an in-person evaluation if the symptoms worsen or if the condition fails to improve as anticipated.  [facility] [phone]  Other Instructions Wear cushioned socks when walking on the floor. No barefeet   If you have been instructed to have an in-person evaluation today at a local Urgent Care facility, please use the link below. It will take you to a list of all of our available [facility], including address, phone number and hours of operation. Please do not delay care.  [facility]  If you or a family member do not have a primary care provider, use the link below to schedule a visit and establish care. When you choose a [facility] primary care physician or advanced practice provider, you gain a long-term partner in health. Find a Primary Care Provider  Learn more about [facility]'s in-office and virtual care options: [facility] Get Care Now

## 2024-03-02 ENCOUNTER — Other Ambulatory Visit (HOSPITAL_COMMUNITY): Payer: Self-pay

## 2024-03-02 NOTE — Telephone Encounter (Signed)
 Pharmacy Patient Advocate Encounter  Received notification from [employer] that Prior Authorization for OZEMPIC  0.25-0.5MG  has been APPROVED from 02/25/24 to UNKNOWN   PA #/Case ID/Reference #: [id number]

## 2024-03-17 ENCOUNTER — Encounter: Payer: Self-pay | Admitting: Neurology

## 2024-03-17 ENCOUNTER — Ambulatory Visit: Admitting: Neurology

## 2024-03-17 VITALS — BP 125/77 | HR 89 | Ht 66.0 in | Wt 238.0 lb

## 2024-03-17 DIAGNOSIS — R519 Headache, unspecified: Secondary | ICD-10-CM | POA: Diagnosis not present

## 2024-03-17 DIAGNOSIS — G4719 Other hypersomnia: Secondary | ICD-10-CM

## 2024-03-17 DIAGNOSIS — R0683 Snoring: Secondary | ICD-10-CM | POA: Diagnosis not present

## 2024-03-17 DIAGNOSIS — E669 Obesity, unspecified: Secondary | ICD-10-CM

## 2024-03-17 DIAGNOSIS — R635 Abnormal weight gain: Secondary | ICD-10-CM

## 2024-03-17 DIAGNOSIS — Z9189 Other specified personal risk factors, not elsewhere classified: Secondary | ICD-10-CM

## 2024-03-17 NOTE — Progress Notes (Signed)
 Subjective:    Patient ID: Megan Barry is a 43 y.o. female.  HPI    True Mar, MD, PhD [facility] [address], [address] [address] [place], [place] [address]  Dear Dr. Benuel,  I saw your patient, Megan Barry, upon your kind request in my sleep clinic today for initial consultation of her sleep disorder, in particular, concern for underlying obstructive sleep apnea.  The patient is unaccompanied today.  As you know, Megan Barry is a 43 year old female with an underlying medical history of hyperlipidemia, diabetes, anxiety, and obesity, who reports snoring and excessive daytime somnolence as well as waking up with a sense of gasping for air.  She does not wake up rested.  Her Epworth sleepiness score is 9 out of 24, fatigue severity score is 53 out of 63.  I reviewed your office note from 01/12/2024.  She works in a call center, in the office.  She lives with her family including husband and 3 children.  They have 1 cat in the household.  She has a TV in the bedroom but it is not on at night.  She is a non-smoker and does not drink any alcohol and does not drink much in the way of caffeine, decaf coffee about 2 to 3 cups/day.  She has had trouble with weight and has gained about 28 to 25 pounds in the past year.  She is not aware of any family history of sleep apnea.  She has had occasional morning headaches, she denies nightly nocturia.  Bedtime is generally between 10 and midnight.  Her Past Medical History Is Significant For: Past Medical History:  Diagnosis Date   Gestational diabetes    insulin     Her Past Surgical History Is Significant For: Past Surgical History:  Procedure Laterality Date   VAGINAL DELIVERY     X 2    Her Family History Is Significant For: Family History  Problem Relation Age of Onset   Diabetes Mother    Hypertension Mother    Breast cancer Neg Hx    Sleep apnea Neg Hx     Her Social History Is Significant For: Social History    Socioeconomic History   Marital status: Married    Spouse name: Not on file   Number of children: Not on file   Years of education: Not on file   Highest education level: GED or equivalent  Occupational History   Not on file  Tobacco Use   Smoking status: Never   Smokeless tobacco: Never  Vaping Use   Vaping status: Never Used  Substance and Sexual Activity   Alcohol use: Not Currently    Comment: rare   Drug use: No   Sexual activity: Yes    Birth control/protection: None  Other Topics Concern   Not on file  Social History Narrative   Pt lives with family    Pt works    Social Drivers of Corporate investment banker Strain: Medium Risk (02/16/2024)   Overall Financial Resource Strain (CARDIA)    Difficulty of Paying Living Expenses: Somewhat hard  Food Insecurity: Food Insecurity Present (02/16/2024)   Hunger Vital Sign    Worried About Running Out of Food in the Last Year: Often true    Ran Out of Food in the Last Year: Sometimes true  Transportation Needs: No Transportation Needs (02/16/2024)   PRAPARE - Administrator, Civil Service (Medical): No    Lack of Transportation (Non-Medical): No  Physical Activity: Inactive (02/16/2024)   Exercise Vital Sign    Days of Exercise per Week: 1 day    Minutes of Exercise per Session: 0 min  Stress: Stress Concern Present (02/16/2024)   [employer] of Occupational Health - Occupational Stress Questionnaire    Feeling of Stress: Rather much  Social Connections: Moderately Isolated (02/16/2024)   Social Connection and Isolation Panel    Frequency of Communication with Friends and Family: More than three times a week    Frequency of Social Gatherings with Friends and Family: Never    Attends Religious Services: Never    Database administrator or Organizations: No    Attends Engineer, structural: Not on file    Marital Status: Married    Her Allergies Are:  Allergies  Allergen Reactions   Other Hives and  Rash    Egg Plant  :   Her Current Medications Are:  Outpatient Encounter Medications as of 03/17/2024  Medication Sig   Multiple Vitamin (MULTIVITAMIN ADULT PO) Take by mouth.   Multiple Vitamins-Minerals (HAIR SKIN & NAILS PO) Take by mouth.   sertraline  (ZOLOFT ) 25 MG tablet Take 1 tablet (25 mg total) by mouth daily.   benzonatate  (TESSALON ) 100 MG capsule Take 1-2 caps PO TID PRN   Blood Pressure Monitoring (BLOOD PRESSURE CUFF) MISC 1 Units by Does not apply route once for 1 dose. Check blood pressure daily   chlorhexidine  (PERIDEX ) 0.12 % solution Use as directed 15 mLs in the mouth or throat 2 (two) times daily.   estradiol  (ESTRACE  VAGINAL) 0.1 MG/GM vaginal cream Place 1 Applicatorful vaginally at bedtime.   fluticasone  (FLONASE ) 50 MCG/ACT nasal spray Place 2 sprays into both nostrils daily.   glucose blood (FREESTYLE LITE) test strip Check blood sugar up to once daily (Patient not taking: Reported on 03/17/2024)   Lancets 33G MISC Check blood sugar up to once daily (Patient not taking: Reported on 03/17/2024)   Minoxidil  5 % FOAM Apply 0.5 Capfuls topically 2 (two) times daily.   omega-3 acid ethyl esters (LOVAZA ) 1 g capsule Take 2 capsules (2 g total) by mouth 2 (two) times daily.   rosuvastatin  (CRESTOR ) 10 MG tablet Take 1 tablet (10 mg total) by mouth daily. (Patient not taking: Reported on 03/17/2024)   Semaglutide ,0.25 or 0.5MG /DOS, 2 MG/3ML SOPN Inject 0.25 mg into the skin once a week. Skip dose if persistent nausea or vomiting and call your doctor   [DISCONTINUED] albuterol  (PROVENTIL  HFA;VENTOLIN  HFA) 108 (90 BASE) MCG/ACT inhaler Inhale 1-2 puffs into the lungs every 6 (six) hours as needed for wheezing or shortness of breath.   No facility-administered encounter medications on file as of 03/17/2024.  :   Review of Systems:  Out of a complete 14 point review of systems, all are reviewed and negative with the exception of these symptoms as listed below:   Review of  Systems  Neurological:        Pt here for sleep consult Pt snores,fatigue,headaches Pt denies sleep study,cpap machine,hypertension    ESS:9 FSS:53     Objective:  Neurological Exam  Physical Exam Physical Examination:   Vitals:   03/17/24 0841  BP: 125/77  Pulse: 89    General Examination: The patient is a very pleasant 43 y.o. female in no acute distress. She appears well-developed and well-nourished and well groomed.   HEENT: Normocephalic, atraumatic, pupils are equal, round and reactive to light, extraocular tracking is good without limitation to gaze excursion or  nystagmus noted. No photophobia.  Hearing is grossly intact.  Face is symmetric with normal facial animation. Speech is clear without dysarthria. There is no hypophonia. There is no lip, neck/head, jaw or voice tremor. Neck is supple with full range of passive and active motion. There are no carotid bruits on auscultation.  Airway/Oropharynx exam reveals: No significant mouth dryness, good dental hygiene, moderate airway crowding secondary to small airway entry, slightly wider uvula, slightly elongated tongue.  Neck circumference 15 7/8 inches, minimal to mild overbite noted.  Tongue protrudes centrally and palate elevates symmetrically.    Chest: Clear to auscultation without wheezing, rhonchi or crackles noted.  Heart: S1+S2+0, regular and normal without murmurs, rubs or gallops noted.   Abdomen: Soft, non-tender and non-distended.  Extremities: There is no pitting edema in the distal lower extremities bilaterally.   Skin: Warm and dry without trophic changes noted.   Musculoskeletal: exam reveals no obvious joint deformities.   Neurologically:  Mental status: The patient is awake, alert and oriented in all 4 spheres. Her immediate and remote memory, attention, language skills and fund of knowledge are appropriate. There is no evidence of aphasia, agnosia, apraxia or anomia. Speech is clear with normal  prosody and enunciation. Thought process is linear. Mood is normal and affect is normal.  Cranial nerves II - XII are as described above under HEENT exam.  Motor exam: Normal bulk, strength and tone is noted. There is no obvious action or resting tremor.  Fine motor skills and coordination: Intact grossly.  Cerebellar testing: No dysmetria or intention tremor. There is no truncal or gait ataxia.  Sensory exam: intact to light touch in the upper and lower extremities.  Gait, station and balance: She stands easily. No veering to one side is noted. No leaning to one side is noted. Posture is age-appropriate and stance is narrow based. Gait shows normal stride length and normal pace. No problems turning are noted.   Assessment and Plan:   In summary, Zynasia Placido is a very pleasant 43 y.o.-year old female with an underlying medical history of hyperlipidemia, diabetes, anxiety, and obesity, whose history and physical exam are concerning for sleep disordered breathing, particularly obstructive sleep apnea (OSA). A laboratory attended sleep study is typically considered gold standard for evaluation of sleep disordered breathing.   I had a long chat with the patient about my findings and the diagnosis of sleep apnea, particularly OSA, its prognosis and treatment options. We talked about medical/conservative treatments, surgical interventions and non-pharmacological approaches for symptom control. I explained, in particular, the risks and ramifications of untreated moderate to severe OSA, especially with respect to developing cardiovascular disease down the road, including congestive heart failure (CHF), difficult to treat hypertension, cardiac arrhythmias (particularly A-fib), neurovascular complications including TIA, stroke and dementia. Even type 2 diabetes has, in part, been linked to untreated OSA. Symptoms of untreated OSA may include (but may not be limited to) daytime sleepiness, nocturia (i.e. frequent  nighttime urination), memory problems, mood irritability and suboptimally controlled or worsening mood disorder such as depression and/or anxiety, lack of energy, lack of motivation, physical discomfort, as well as recurrent headaches, especially morning or nocturnal headaches. We talked about the importance of maintaining a healthy lifestyle and striving for healthy weight. In addition, we talked about the importance of striving for and maintaining good sleep hygiene. I recommended a sleep study at this time. I outlined the differences between a laboratory attended sleep study which is considered more comprehensive and accurate over the  option of a home sleep test (HST); the latter may lead to underestimation of sleep disordered breathing in some instances and does not help with diagnosing upper airway resistance syndrome and is not accurate enough to diagnose primary central sleep apnea typically. I outlined possible surgical and non-surgical treatment options of OSA, including the use of a positive airway pressure (PAP) device (i.e. CPAP, AutoPAP/APAP or BiPAP in certain circumstances), a custom-made dental device (aka oral appliance, which would require a referral to a specialist dentist or orthodontist typically, and is generally speaking not considered for patients with full dentures or edentulous state), upper airway surgical options, such as traditional UPPP (which is not considered a first-line treatment) or the Inspire device (hypoglossal nerve stimulator, which would involve a referral for consultation with an ENT surgeon, after careful selection, following inclusion criteria - also not first-line treatment). I explained the PAP treatment option to the patient in detail, as this is generally considered first-line treatment.  The patient indicated that she would be willing to try PAP therapy, if the need arises. I explained the importance of being compliant with PAP treatment, not only for insurance  purposes but primarily to improve patient's symptoms symptoms, and for the patient's long term health benefit, including to reduce Her cardiovascular risks longer-term.    We will pick up our discussion about the next steps and treatment options after testing.  We will keep her posted as to the test results by phone call and/or MyChart messaging where possible.  We will plan to follow-up in sleep clinic accordingly as well.  I answered all her questions today and the patient was in agreement.   I encouraged her to call with any interim questions, concerns, problems or updates or email us  through MyChart.  Generally speaking, sleep test authorizations may take up to 2 weeks, sometimes less, sometimes longer, the patient is encouraged to get in touch with us  if they do not hear back from the sleep lab staff directly within the next 2 weeks.  Thank you very much for allowing me to participate in the care of this nice patient. If I can be of any further assistance to you please do not hesitate to call me at [phone].  Sincerely,   True Mar, MD, PhD

## 2024-03-17 NOTE — Patient Instructions (Signed)

## 2024-03-25 LAB — LAB REPORT - SCANNED
A1c: 8
EGFR: 124

## 2024-03-30 ENCOUNTER — Telehealth: Payer: Self-pay | Admitting: Neurology

## 2024-03-30 NOTE — Telephone Encounter (Signed)
 NPSG [employer] pending

## 2024-04-07 ENCOUNTER — Encounter: Payer: Self-pay | Admitting: Student

## 2024-04-19 NOTE — Telephone Encounter (Signed)
 [employer] denied the NPSG see below for the denial.  HST [employer] is pending.

## 2024-05-14 ENCOUNTER — Encounter: Payer: Self-pay | Admitting: Student

## 2024-05-14 ENCOUNTER — Telehealth: Admitting: Family Medicine

## 2024-05-14 DIAGNOSIS — R42 Dizziness and giddiness: Secondary | ICD-10-CM | POA: Diagnosis not present

## 2024-05-14 DIAGNOSIS — E1169 Type 2 diabetes mellitus with other specified complication: Secondary | ICD-10-CM | POA: Diagnosis not present

## 2024-05-14 MED ORDER — FREESTYLE LITE TEST VI STRP: ORAL_STRIP | 3 refills | Status: AC

## 2024-05-14 NOTE — Patient Instructions (Signed)
 Dizziness Dizziness is a common problem. It makes you feel unsteady or light-headed. You may feel like you're about to faint. Dizziness can lead to getting hurt if you stumble or fall. It's more common to feel dizzy if you're an older adult. Many things can cause you to feel dizzy. These include: Medicines. Dehydration. This is when there's not enough water in your body. Illness. Follow these instructions at home: Eating and drinking  Drink enough fluid to keep your pee (urine) pale yellow. This helps keep you from getting dehydrated. Try to drink more clear fluids, such as water. Do not drink alcohol. Try to limit how much caffeine you take in. Try to limit how much salt, also called sodium, you take in. Activity Try not to make quick movements. Stand up slowly from sitting in a chair. Steady yourself until you feel okay. In the morning, first sit up on the side of the bed. When you feel okay, hold onto something and slowly stand up. Do this until you know that your balance is okay. If you need to stand in one place for a long time, move your legs often. Tighten and relax the muscles in your legs while you're standing. Do not drive or use machines if you feel dizzy. Avoid bending down if you feel dizzy. Place items in your home so you can reach them without leaning over. Lifestyle Do not smoke, vape, or use products with nicotine or tobacco in them. If you need help quitting, talk with your health care provider. Try to lower your stress level. You can do this by using methods like yoga or meditation. Talk with your provider if you need help. General instructions Watch your dizziness for any changes. Take your medicines only as told by your provider. Talk with your provider if you think you're dizzy because of a medicine you're taking. Tell a friend or a family member that you're feeling dizzy. If they spot any changes in your behavior, have them call your provider. Contact a health care  provider if: Your dizziness doesn't go away, or you have new symptoms. Your dizziness gets worse. You feel like you may vomit. You have trouble hearing. You have a fever. You have neck pain or a stiff neck. You fall or get hurt. Get help right away if: You vomit each time you eat or drink. You have watery poop and can't eat or drink. You have trouble talking, walking, swallowing, or using your arms, hands, or legs. You feel very weak. You're bleeding. You're not thinking clearly, or you have trouble forming sentences. A friend or family member may spot this. Your vision changes, or you get a very bad headache. These symptoms may be an emergency. Call 911 right away. Do not wait to see if the symptoms will go away. Do not drive yourself to the hospital. This information is not intended to replace advice given to you by your health care provider. Make sure you discuss any questions you have with your health care provider. Document Revised: 02/27/2023 Document Reviewed: 07/11/2022 Elsevier Patient Education  2024 [employer].

## 2024-05-14 NOTE — Progress Notes (Signed)
 Virtual Visit Consent   Megan Barry, you are scheduled for a virtual visit with a [facility] provider today. Just as with appointments in the office, your consent must be obtained to participate. Your consent will be active for this visit and any virtual visit you may have with one of our providers in the next 365 days. If you have a MyChart account, a copy of this consent can be sent to you electronically.  As this is a virtual visit, video technology does not allow for your provider to perform a traditional examination. This may limit your provider's ability to fully assess your condition. If your provider identifies any concerns that need to be evaluated in person or the need to arrange testing (such as labs, EKG, etc.), we will make arrangements to do so. Although advances in technology are sophisticated, we cannot ensure that it will always work on either your end or our end. If the connection with a video visit is poor, the visit may have to be switched to a telephone visit. With either a video or telephone visit, we are not always able to ensure that we have a secure connection.  By engaging in this virtual visit, you consent to the provision of healthcare and authorize for your insurance to be billed (if applicable) for the services provided during this visit. Depending on your insurance coverage, you may receive a charge related to this service.  I need to obtain your verbal consent now. Are you willing to proceed with your visit today? Megan Barry has provided verbal consent on 05/14/2024 for a virtual visit (video or telephone). Megan Lamp, FNP  Date: 05/14/2024 2:14 PM   Virtual Visit via Video Note   I, Megan Barry, connected with  Megan Barry  ([id number], [birth year]) on 05/14/24 at  2:15 PM EST by a video-enabled telemedicine application and verified that I am speaking with the correct person using two identifiers.  Location: Patient: Virtual Visit Location Patient:  Home Provider: Virtual Visit Location Provider: Home Office   I discussed the limitations of evaluation and management by telemedicine and the availability of in person appointments. The patient expressed understanding and agreed to proceed.    History of Present Illness: Megan Barry is a 43 y.o. who identifies as a female who was assigned female at birth, and is being seen today for feeling lightheaded, headache, has fmla to take 2 days a month, sinus pressure, allergy sx. Feels exhausted. She is out of glucose strips and hasn't checked her sugar. She has not checked BP. Says she feels better this afternoon.  HPI: HPI  Problems:  Patient Active Problem List   Diagnosis Date Noted   Acute viral syndrome 02/17/2024   Has received first dose of hepatitis B vaccine 01/12/2024   Perimenopause 01/12/2024   Snoring 01/12/2024   Morbid obesity (HCC) 02/26/2023   Hyperlipidemia associated with type 2 diabetes mellitus (HCC) 02/26/2023   Fatigue 10/24/2022   GAD (generalized anxiety disorder) 10/24/2022   Positive QuantiFERON-TB Gold test 10/24/2022   Health care maintenance 07/25/2022   Type II diabetes mellitus (HCC) 07/24/2021   Hypertension 07/24/2021    Allergies:  Allergies  Allergen Reactions   Other Hives and Rash    Egg Plant   Medications:  Current Outpatient Medications:    benzonatate  (TESSALON ) 100 MG capsule, Take 1-2 caps PO TID PRN, Disp: 20 capsule, Rfl: 0   Blood Pressure Monitoring (BLOOD PRESSURE CUFF) MISC, 1 Units by Does not apply route once for 1  dose. Check blood pressure daily, Disp: 1 each, Rfl: 0   chlorhexidine  (PERIDEX ) 0.12 % solution, Use as directed 15 mLs in the mouth or throat 2 (two) times daily., Disp: 900 mL, Rfl: 0   estradiol  (ESTRACE  VAGINAL) 0.1 MG/GM vaginal cream, Place 1 Applicatorful vaginally at bedtime., Disp: 42.5 g, Rfl: 12   fluticasone  (FLONASE ) 50 MCG/ACT nasal spray, Place 2 sprays into both nostrils daily., Disp: 16 g, Rfl: 0    glucose blood (FREESTYLE LITE) test strip, Check blood sugar up to once daily, Disp: 100 each, Rfl: 3   Lancets 33G MISC, Check blood sugar up to once daily (Patient not taking: Reported on 03/17/2024), Disp: 100 each, Rfl: 3   Minoxidil  5 % FOAM, Apply 0.5 Capfuls topically 2 (two) times daily., Disp: 60 g, Rfl: 0   Multiple Vitamin (MULTIVITAMIN ADULT PO), Take by mouth., Disp: , Rfl:    Multiple Vitamins-Minerals (HAIR SKIN & NAILS PO), Take by mouth., Disp: , Rfl:    omega-3 acid ethyl esters (LOVAZA ) 1 g capsule, Take 2 capsules (2 g total) by mouth 2 (two) times daily., Disp: 120 capsule, Rfl: 11   rosuvastatin  (CRESTOR ) 10 MG tablet, Take 1 tablet (10 mg total) by mouth daily. (Patient not taking: Reported on 03/17/2024), Disp: 30 tablet, Rfl: 11   Semaglutide ,0.25 or 0.5MG /DOS, 2 MG/3ML SOPN, Inject 0.25 mg into the skin once a week. Skip dose if persistent nausea or vomiting and call your doctor, Disp: 3 mL, Rfl: 3   sertraline  (ZOLOFT ) 25 MG tablet, Take 1 tablet (25 mg total) by mouth daily., Disp: 30 tablet, Rfl: 3  Observations/Objective: Patient is well-developed, well-nourished in no acute distress.  Resting comfortably  at home.  Head is normocephalic, atraumatic.  No labored breathing.  Speech is clear and coherent with logical content.  Patient is alert and oriented at baseline.    Assessment and Plan: 1. Type 2 diabetes mellitus with other specified complication, unspecified whether long term insulin  use (HCC) (Primary)  2. Light headed  UC if sx persist or worsen.   Follow Up Instructions: I discussed the assessment and treatment plan with the patient. The patient was provided an opportunity to ask questions and all were answered. The patient agreed with the plan and demonstrated an understanding of the instructions.  A copy of instructions were sent to the patient via MyChart unless otherwise noted below.     The patient was advised to call back or seek an in-person  evaluation if the symptoms worsen or if the condition fails to improve as anticipated.    Beauty Pless, FNP

## 2024-05-19 ENCOUNTER — Ambulatory Visit: Admitting: Student

## 2024-05-20 ENCOUNTER — Ambulatory Visit: Admitting: Student

## 2024-05-20 NOTE — Telephone Encounter (Signed)
 [facility] [place]: [id number] (exp. 05/13/24 to 11/11/24)

## 2024-05-21 ENCOUNTER — Encounter: Payer: Self-pay | Admitting: Student

## 2024-05-26 MED ORDER — SERTRALINE HCL 25 MG PO TABS: 25.0000 mg | ORAL_TABLET | Freq: Every day | ORAL | 3 refills | Status: AC

## 2024-06-01 ENCOUNTER — Telehealth: Admitting: Family Medicine

## 2024-06-01 DIAGNOSIS — J40 Bronchitis, not specified as acute or chronic: Secondary | ICD-10-CM

## 2024-06-01 MED ORDER — PROMETHAZINE-DM 6.25-15 MG/5ML PO SYRP: 5.0000 mL | ORAL_SOLUTION | Freq: Four times a day (QID) | ORAL | 0 refills | Status: AC | PRN

## 2024-06-01 MED ORDER — ALBUTEROL SULFATE HFA 108 (90 BASE) MCG/ACT IN AERS: 2.0000 | INHALATION_SPRAY | Freq: Four times a day (QID) | RESPIRATORY_TRACT | 0 refills | Status: AC | PRN

## 2024-06-01 NOTE — Progress Notes (Signed)
 " Virtual Visit Consent   Megan Barry, you are scheduled for a virtual visit with a [facility] provider today. Just as with appointments in the office, your consent must be obtained to participate. Your consent will be active for this visit and any virtual visit you may have with one of our providers in the next 365 days. If you have a MyChart account, a copy of this consent can be sent to you electronically.  As this is a virtual visit, video technology does not allow for your provider to perform a traditional examination. This may limit your provider's ability to fully assess your condition. If your provider identifies any concerns that need to be evaluated in person or the need to arrange testing (such as labs, EKG, etc.), we will make arrangements to do so. Although advances in technology are sophisticated, we cannot ensure that it will always work on either your end or our end. If the connection with a video visit is poor, the visit may have to be switched to a telephone visit. With either a video or telephone visit, we are not always able to ensure that we have a secure connection.  By engaging in this virtual visit, you consent to the provision of healthcare and authorize for your insurance to be billed (if applicable) for the services provided during this visit. Depending on your insurance coverage, you may receive a charge related to this service.  I need to obtain your verbal consent now. Are you willing to proceed with your visit today? Megan Barry has provided verbal consent on 06/01/2024 for a virtual visit (video or telephone). Loa Lamp, FNP  Date: 06/01/2024 2:49 PM   Virtual Visit via Video Note   I, Loa Lamp, connected with  Megan Barry  ([id number], Jun 25, 1980) on 06/01/2024 at  2:45 PM EST by a video-enabled telemedicine application and verified that I am speaking with the correct person using two identifiers.  Location: Patient: Virtual Visit Location Patient:  Home Provider: Virtual Visit Location Provider: Home Office   I discussed the limitations of evaluation and management by telemedicine and the availability of in person appointments. The patient expressed understanding and agreed to proceed.    History of Present Illness: Megan Barry is a 43 y.o. who identifies as a female who was assigned female at birth, and is being seen today for sore throat, cough, sx today, no flu or covid exposure. No history of asthma/She is diabetic. White mucus. Cough is keeping her up at night. Had to leave work today .  HPI: HPI  Problems:  Patient Active Problem List   Diagnosis Date Noted   Acute viral syndrome 02/17/2024   Has received first dose of hepatitis B vaccine 01/12/2024   Perimenopause 01/12/2024   Snoring 01/12/2024   Morbid obesity (HCC) 02/26/2023   Hyperlipidemia associated with type 2 diabetes mellitus (HCC) 02/26/2023   Fatigue 10/24/2022   GAD (generalized anxiety disorder) 10/24/2022   Positive QuantiFERON-TB Gold test 10/24/2022   Health care maintenance 07/25/2022   Type II diabetes mellitus (HCC) 07/24/2021   Hypertension 07/24/2021    Allergies: Allergies[1] Medications: Current Medications[2]  Observations/Objective: Patient is well-developed, well-nourished in no acute distress.  Resting comfortably  at home.  Head is normocephalic, atraumatic.  No labored breathing.  Speech is clear and coherent with logical content.  Patient is alert and oriented at baseline.    Assessment and Plan: 1. Bronchitis (Primary)  Increase fluids humidifier at night, tylenol  or ibuprofen . UC if sx worsen.  In home flu and covid testing advised.   Follow Up Instructions: I discussed the assessment and treatment plan with the patient. The patient was provided an opportunity to ask questions and all were answered. The patient agreed with the plan and demonstrated an understanding of the instructions.  A copy of instructions were sent to the  patient via MyChart unless otherwise noted below.    The patient was advised to call back or seek an in-person evaluation if the symptoms worsen or if the condition fails to improve as anticipated.    Megan Nobile, FNP     [1]  Allergies Allergen Reactions   Other Hives and Rash    Egg Plant  [2]  Current Outpatient Medications:    albuterol  (VENTOLIN  HFA) 108 (90 Base) MCG/ACT inhaler, Inhale 2 puffs into the lungs every 6 (six) hours as needed for wheezing or shortness of breath., Disp: 8 g, Rfl: 0   promethazine -dextromethorphan (PROMETHAZINE -DM) 6.25-15 MG/5ML syrup, Take 5 mLs by mouth 4 (four) times daily as needed for up to 10 days for cough., Disp: 118 mL, Rfl: 0   benzonatate  (TESSALON ) 100 MG capsule, Take 1-2 caps PO TID PRN, Disp: 20 capsule, Rfl: 0   Blood Pressure Monitoring (BLOOD PRESSURE CUFF) MISC, 1 Units by Does not apply route once for 1 dose. Check blood pressure daily, Disp: 1 each, Rfl: 0   chlorhexidine  (PERIDEX ) 0.12 % solution, Use as directed 15 mLs in the mouth or throat 2 (two) times daily., Disp: 900 mL, Rfl: 0   estradiol  (ESTRACE  VAGINAL) 0.1 MG/GM vaginal cream, Place 1 Applicatorful vaginally at bedtime., Disp: 42.5 g, Rfl: 12   fluticasone  (FLONASE ) 50 MCG/ACT nasal spray, Place 2 sprays into both nostrils daily., Disp: 16 g, Rfl: 0   glucose blood (FREESTYLE LITE) test strip, Check blood sugar up to once daily, Disp: 100 each, Rfl: 3   Lancets 33G MISC, Check blood sugar up to once daily (Patient not taking: Reported on 03/17/2024), Disp: 100 each, Rfl: 3   Minoxidil  5 % FOAM, Apply 0.5 Capfuls topically 2 (two) times daily., Disp: 60 g, Rfl: 0   Multiple Vitamin (MULTIVITAMIN ADULT PO), Take by mouth., Disp: , Rfl:    Multiple Vitamins-Minerals (HAIR SKIN & NAILS PO), Take by mouth., Disp: , Rfl:    omega-3 acid ethyl esters (LOVAZA ) 1 g capsule, Take 2 capsules (2 g total) by mouth 2 (two) times daily., Disp: 120 capsule, Rfl: 11   rosuvastatin   (CRESTOR ) 10 MG tablet, Take 1 tablet (10 mg total) by mouth daily. (Patient not taking: Reported on 03/17/2024), Disp: 30 tablet, Rfl: 11   Semaglutide ,0.25 or 0.5MG /DOS, 2 MG/3ML SOPN, Inject 0.25 mg into the skin once a week. Skip dose if persistent nausea or vomiting and call your doctor, Disp: 3 mL, Rfl: 3   sertraline  (ZOLOFT ) 25 MG tablet, Take 1 tablet (25 mg total) by mouth daily., Disp: 30 tablet, Rfl: 3  "

## 2024-06-01 NOTE — Patient Instructions (Signed)
